# Patient Record
Sex: Male | Born: 1970 | Race: White | Hispanic: Yes | Smoking: Former smoker
Health system: Southern US, Community
[De-identification: ages and names within clinical notes are randomized; demographics above are authoritative.]

## PROBLEM LIST (undated history)

## (undated) DIAGNOSIS — Z8673 Personal history of transient ischemic attack (TIA), and cerebral infarction without residual deficits: Secondary | ICD-10-CM

## (undated) DIAGNOSIS — Z87828 Personal history of other (healed) physical injury and trauma: Secondary | ICD-10-CM

## (undated) DIAGNOSIS — K219 Gastro-esophageal reflux disease without esophagitis: Secondary | ICD-10-CM

## (undated) DIAGNOSIS — F419 Anxiety disorder, unspecified: Secondary | ICD-10-CM

## (undated) DIAGNOSIS — I10 Essential (primary) hypertension: Secondary | ICD-10-CM

## (undated) DIAGNOSIS — F1010 Alcohol abuse, uncomplicated: Secondary | ICD-10-CM

## (undated) DIAGNOSIS — D696 Thrombocytopenia, unspecified: Secondary | ICD-10-CM

## (undated) DIAGNOSIS — R748 Abnormal levels of other serum enzymes: Secondary | ICD-10-CM

## (undated) DIAGNOSIS — S83207A Unspecified tear of unspecified meniscus, current injury, left knee, initial encounter: Secondary | ICD-10-CM

## (undated) DIAGNOSIS — F4310 Post-traumatic stress disorder, unspecified: Secondary | ICD-10-CM

## (undated) HISTORY — PX: SKIN GRAFT FULL THICKNESS ARM: SUR1297

## (undated) HISTORY — PX: WISDOM TOOTH EXTRACTION: SHX21

## (undated) HISTORY — PX: POSTERIOR FUSION CERVICAL SPINE: SUR628

---

## 1999-07-22 ENCOUNTER — Emergency Department (HOSPITAL_COMMUNITY): Admission: EM | Admit: 1999-07-22 | Discharge: 1999-07-22 | Payer: Self-pay | Admitting: *Deleted

## 1999-07-29 ENCOUNTER — Ambulatory Visit (HOSPITAL_COMMUNITY): Admission: RE | Admit: 1999-07-29 | Discharge: 1999-07-29 | Payer: Self-pay | Admitting: Orthopedic Surgery

## 1999-11-03 HISTORY — PX: WRIST SURGERY: SHX841

## 2000-04-15 ENCOUNTER — Emergency Department (HOSPITAL_COMMUNITY): Admission: EM | Admit: 2000-04-15 | Discharge: 2000-04-15 | Payer: Self-pay | Admitting: Emergency Medicine

## 2003-06-04 ENCOUNTER — Emergency Department (HOSPITAL_COMMUNITY): Admission: EM | Admit: 2003-06-04 | Discharge: 2003-06-04 | Payer: Self-pay | Admitting: Emergency Medicine

## 2003-06-04 ENCOUNTER — Encounter: Payer: Self-pay | Admitting: Emergency Medicine

## 2004-04-04 ENCOUNTER — Emergency Department (HOSPITAL_COMMUNITY): Admission: EM | Admit: 2004-04-04 | Discharge: 2004-04-04 | Payer: Self-pay | Admitting: Emergency Medicine

## 2004-05-28 ENCOUNTER — Emergency Department (HOSPITAL_COMMUNITY): Admission: EM | Admit: 2004-05-28 | Discharge: 2004-05-28 | Payer: Self-pay | Admitting: Emergency Medicine

## 2005-01-12 ENCOUNTER — Ambulatory Visit (HOSPITAL_COMMUNITY): Admission: RE | Admit: 2005-01-12 | Discharge: 2005-01-12 | Payer: Self-pay

## 2005-01-27 ENCOUNTER — Emergency Department (HOSPITAL_COMMUNITY): Admission: EM | Admit: 2005-01-27 | Discharge: 2005-01-27 | Payer: Self-pay | Admitting: Emergency Medicine

## 2005-08-24 ENCOUNTER — Emergency Department: Payer: Self-pay | Admitting: Emergency Medicine

## 2006-06-22 ENCOUNTER — Emergency Department (HOSPITAL_COMMUNITY): Admission: EM | Admit: 2006-06-22 | Discharge: 2006-06-23 | Payer: Self-pay | Admitting: Emergency Medicine

## 2006-09-14 ENCOUNTER — Ambulatory Visit: Payer: Self-pay

## 2010-07-27 ENCOUNTER — Emergency Department (HOSPITAL_COMMUNITY)
Admission: EM | Admit: 2010-07-27 | Discharge: 2010-07-27 | Payer: Self-pay | Source: Home / Self Care | Admitting: Emergency Medicine

## 2010-11-27 ENCOUNTER — Ambulatory Visit (HOSPITAL_COMMUNITY)
Admission: RE | Admit: 2010-11-27 | Discharge: 2010-11-27 | Payer: Self-pay | Source: Home / Self Care | Attending: Orthopedic Surgery | Admitting: Orthopedic Surgery

## 2010-11-27 LAB — SURGICAL PCR SCREEN
MRSA, PCR: NEGATIVE
Staphylococcus aureus: NEGATIVE

## 2010-11-27 LAB — BASIC METABOLIC PANEL
BUN: 14 mg/dL (ref 6–23)
Creatinine, Ser: 0.98 mg/dL (ref 0.4–1.5)
GFR calc Af Amer: >60 mL/min (ref >60)
GFR calc non Af Amer: >60 mL/min (ref >60)
Glucose, Bld: 109 mg/dL — ABNORMAL HIGH (ref 70–99)
Sodium: 140 mEq/L (ref 135–145)

## 2010-11-27 LAB — CBC
HCT: 46.8 % (ref 39.0–52.0)
Hemoglobin: 16.9 g/dL (ref 13.0–17.0)
Platelets: 112 10*3/uL — ABNORMAL LOW (ref 150–400)
RBC: 5.57 MIL/uL (ref 4.22–5.81)
RDW: 12.2 % (ref 11.5–15.5)
WBC: 5.8 10*3/uL (ref 4.0–10.5)

## 2010-12-01 ENCOUNTER — Ambulatory Visit (HOSPITAL_COMMUNITY)
Admission: RE | Admit: 2010-12-01 | Discharge: 2010-12-02 | Payer: Self-pay | Source: Home / Self Care | Attending: Neurological Surgery | Admitting: Neurological Surgery

## 2010-12-25 NOTE — Op Note (Signed)
Christopher [name], Christopher [name]              ACCOUNT NO.:  [id number]  MEDICAL RECORD NO.:  [id number]          PATIENT TYPE:  OIB  LOCATION:  3537                         FACILITY:  [facility]  PHYSICIAN:  Stefani Dama, M.D.  DATE OF BIRTH:  02/21/1971  DATE OF PROCEDURE:  12/01/2010 DATE OF DISCHARGE:                              OPERATIVE REPORT   PREOPERATIVE DIAGNOSIS:  Cervical spondylosis and herniated nucleus pulposus C4-5 left with left cervical radiculopathy.  POSTOPERATIVE DIAGNOSIS:  Cervical spondylosis and herniated nucleus pulposus C4-5 left with left cervical radiculopathy.  PROCEDURE:  METRx diskectomy C4-5 left, operating microscope, microdissection technique.  SURGEON:  Stefani Dama, MD  FIRST ASSISTANT:  Cristi Loron, MD  ANESTHESIA:  General endotracheal.  INDICATIONS:  Christopher [name] is a 40 year old individual who has had significant left neck, shoulder, and arm pain proximally in the region of the deltoid and biceps on the left side.  He occasionally will get some radicular pain on the radial aspect of the forearm.  Most of his problem has been relegated to the region of the shoulder, and he has evidence of spondylitic disease at C5-6 and to the lesser extent C4-5 with a fragment of disk in the foramen on the left side at C4-5.  He is advised regarding options for surgery and planned cervical laminotomy, foraminotomy, and diskectomy using METRx system in a seated position, C4- 5 on the left side.  PROCEDURE:  The patient was brought to the operating room having had central monitoring catheter placed in the preop holding area.  After the smooth induction of general endotracheal anesthesia, he was placed in a 3-pin headrest and placed into the seated position.  The fluoroscope was brought into the appropriate position for obtaining lateral images and C4-5 was localized.  The back of the neck was then prepped with alcohol and DuraPrep, draped in sterile  fashion.  C4-5 was again localized with the needle and the skin above this area was infiltrated with 10 mL of lidocaine and 0.5% Marcaine, 50/50 with 1:200,000 epinephrine.  A small vertical incision was made over the chosen area on the left side and then a K-wire was placed down to the laminar arch of L4 at the junction of the facet.  A winding technique was then used to enlarge this opening and place a series of dilators to the 18-mm diameter.  An 18-mm x 5 cm deep endoscopic cannula was then fitted to the operating table clamp. Through this aperture, the operating microscope was brought into position and the soft tissues at the depth of the opening between the lamina of C4 and C5 were cleared.  Once the soft tissues were cleared and the laminotomy site was identified, then a high-speed drill was used under high magnification to remove the inferior margin lamina out to the medial wall facet.  The C4 and C5 several fluoroscopic images were obtained during this procedure to look at her progress as we uncovered the foramen and then identified the path of C5 nerve root in the foramen.  Nerve root was noted to be placed rather dorsally as though a significant ventral mass was  pushing it in that direction.  Once an adequate foraminotomy over the nerve was created, epidural venous bleeding was controlled with bipolar cautery and some small pledgets of Gelfoam along with incising the epidural veins were cauterized.  The inferior aspect of the nerve was then identified and cleared and after clearing some bone in the region of the foramen just at the junction with pedicle, I was able to mobilize the nerve root superiorly and medially in this area.  A significant bony ridge was encountered. Further palpation yielded no evidence of disk fragment.  The root was then tracked laterally and by gently dissecting in a rather large foraminotomy, I could palpate out under the nerve root sleeve to see  if there is any disk fragments.  None were found.  Because the mass was felt to be substantial from the superior aspect of the nerve root, I then explored and we felt a significant mass.  However, upon incising this, this was a superior edge of the inferior vertebrae's endplate and after incising this area, only some small fragments of disk were obtained here, but none of the large fragments I anticipated based on the images.  We then completed a foraminotomy and completed dissection from the superior aspect and in the end the nerve root was freed inferiorly and superiorly, but there was a ventral mass from the superior endplate of the C5 vertebrae in the lateral region.  Hemostasis was achieved in the soft tissues.  Further dissection and examination of this area above both myself and Dr. Lovell Sheehan yielded no fragments of disk.  With this, we completed the procedure, removed the microscope, removed the endoscopic cannula, irrigated the wound copiously with antibiotic irrigating solution and closed the fascia with 3-0 Vicryl interrupted fashion, 3-0 Vicryl using subcuticular tissues.  Dermabond was placed on the skin.  Blood loss for the procedure was nil.     Stefani Dama, M.D.     Merla Riches  D:  12/01/2010  T:  12/02/2010  Job:  [id number]  Electronically Signed by Barnett Abu M.D. on 12/25/2010 08:01:48 AM

## 2011-04-22 ENCOUNTER — Observation Stay (HOSPITAL_COMMUNITY)
Admission: EM | Admit: 2011-04-22 | Discharge: 2011-04-23 | Disposition: A | Discharge status: Home / Self Care | Payer: Self-pay | Attending: Emergency Medicine | Admitting: Emergency Medicine

## 2011-04-22 ENCOUNTER — Emergency Department (HOSPITAL_COMMUNITY): Payer: Self-pay

## 2011-04-22 DIAGNOSIS — R0602 Shortness of breath: Secondary | ICD-10-CM | POA: Insufficient documentation

## 2011-04-22 DIAGNOSIS — Z79899 Other long term (current) drug therapy: Secondary | ICD-10-CM | POA: Insufficient documentation

## 2011-04-22 DIAGNOSIS — R071 Chest pain on breathing: Principal | ICD-10-CM | POA: Insufficient documentation

## 2011-04-22 DIAGNOSIS — I10 Essential (primary) hypertension: Secondary | ICD-10-CM | POA: Insufficient documentation

## 2011-04-22 LAB — TROPONIN I
Troponin I: <0.3 ng/mL (ref <0.30)
Troponin I: <0.3 ng/mL (ref <0.30)

## 2011-04-22 LAB — CK TOTAL AND CKMB (NOT AT ARMC)
CK, MB: 5.1 ng/mL — ABNORMAL HIGH (ref 0.3–4.0)
CK, MB: 4.3 ng/mL — ABNORMAL HIGH (ref 0.3–4.0)
Relative Index: 2.1 (ref 0.0–2.5)
Total CK: 205 U/L (ref 7–232)

## 2011-04-22 LAB — POCT I-STAT, CHEM 8
Creatinine, Ser: 0.9 mg/dL (ref 0.50–1.35)
HCT: 45 % (ref 39.0–52.0)
Sodium: 143 mEq/L (ref 135–145)

## 2011-04-23 ENCOUNTER — Observation Stay (HOSPITAL_COMMUNITY): Payer: Self-pay

## 2011-04-23 MED ORDER — IOHEXOL 350 MG/ML SOLN: 80.0000 mL | Freq: Once | INTRAVENOUS | Status: DC | PRN

## 2011-05-11 ENCOUNTER — Emergency Department (HOSPITAL_COMMUNITY): Payer: Self-pay

## 2011-05-11 ENCOUNTER — Emergency Department (HOSPITAL_COMMUNITY)
Admission: EM | Admit: 2011-05-11 | Discharge: 2011-05-11 | Disposition: A | Discharge status: Home / Self Care | Payer: Self-pay | Attending: Emergency Medicine | Admitting: Emergency Medicine

## 2011-05-11 DIAGNOSIS — M79609 Pain in unspecified limb: Secondary | ICD-10-CM | POA: Insufficient documentation

## 2011-05-11 DIAGNOSIS — H11009 Unspecified pterygium of unspecified eye: Secondary | ICD-10-CM | POA: Insufficient documentation

## 2011-05-11 DIAGNOSIS — S60229A Contusion of unspecified hand, initial encounter: Secondary | ICD-10-CM | POA: Insufficient documentation

## 2011-05-11 DIAGNOSIS — Z79899 Other long term (current) drug therapy: Secondary | ICD-10-CM | POA: Insufficient documentation

## 2011-05-11 DIAGNOSIS — H1130 Conjunctival hemorrhage, unspecified eye: Secondary | ICD-10-CM | POA: Insufficient documentation

## 2011-08-24 ENCOUNTER — Emergency Department (HOSPITAL_COMMUNITY)
Admission: EM | Admit: 2011-08-24 | Discharge: 2011-08-24 | Disposition: A | Discharge status: Home / Self Care | Payer: Worker's Compensation | Attending: Emergency Medicine | Admitting: Emergency Medicine

## 2011-08-24 DIAGNOSIS — M5380 Other specified dorsopathies, site unspecified: Secondary | ICD-10-CM | POA: Insufficient documentation

## 2011-08-24 DIAGNOSIS — M549 Dorsalgia, unspecified: Secondary | ICD-10-CM | POA: Insufficient documentation

## 2011-08-24 DIAGNOSIS — I10 Essential (primary) hypertension: Secondary | ICD-10-CM | POA: Insufficient documentation

## 2012-01-18 IMAGING — CR DG HAND COMPLETE 3+V*R*
3 series · 3 of 3 positions shown · non-contrast
Comparison: None.

CLINICAL DATA: Metacarpal pain and swelling after injury.

RIGHT HAND - COMPLETE 3+ VIEW

[x hand pa right *]
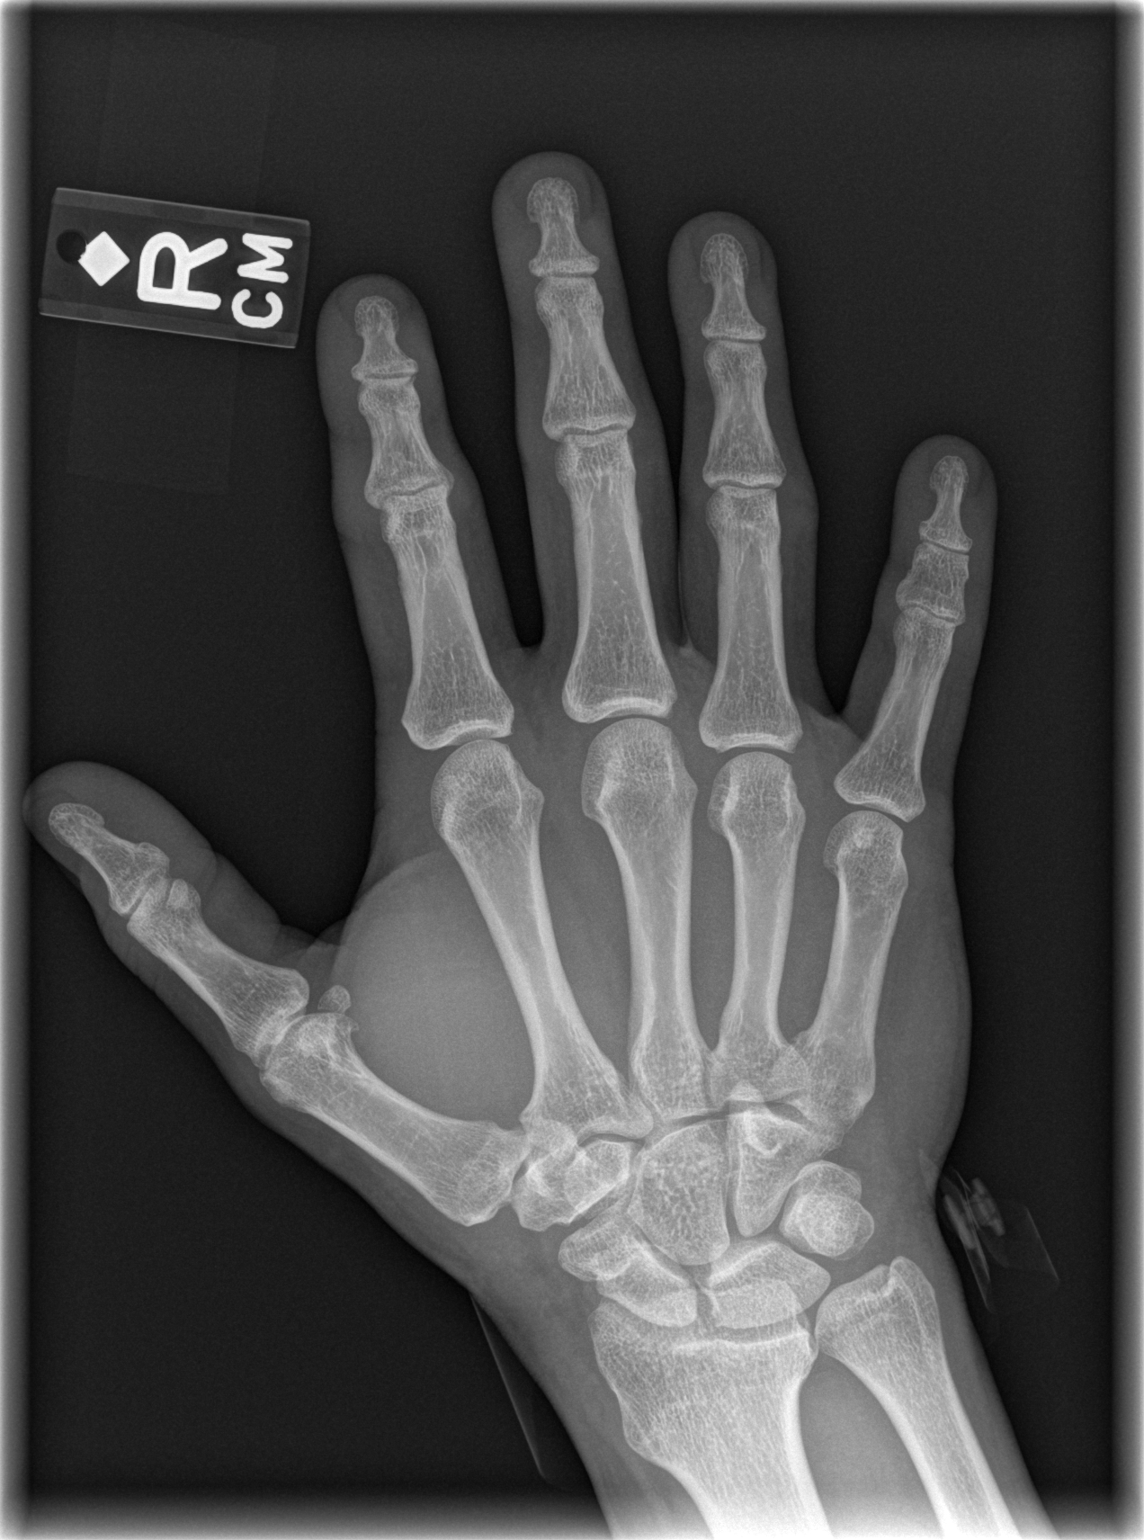

[x hand oblique right *]
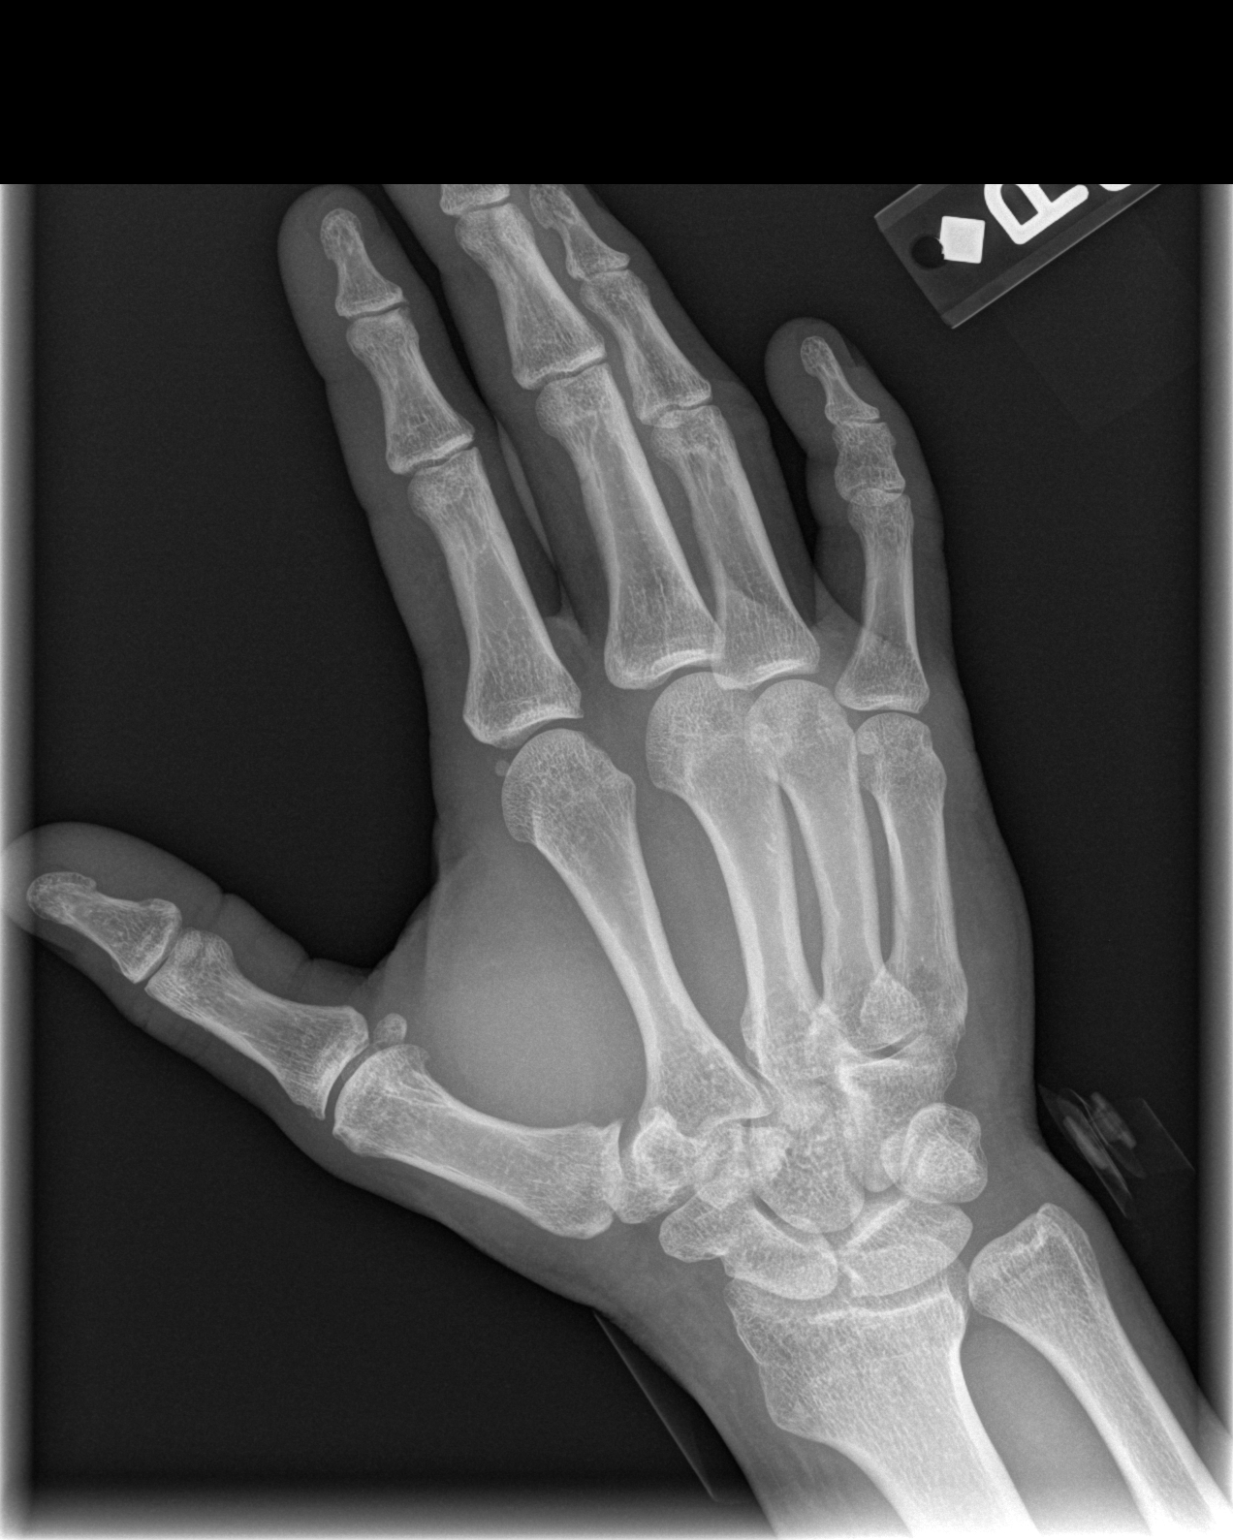

[x hand lat right]
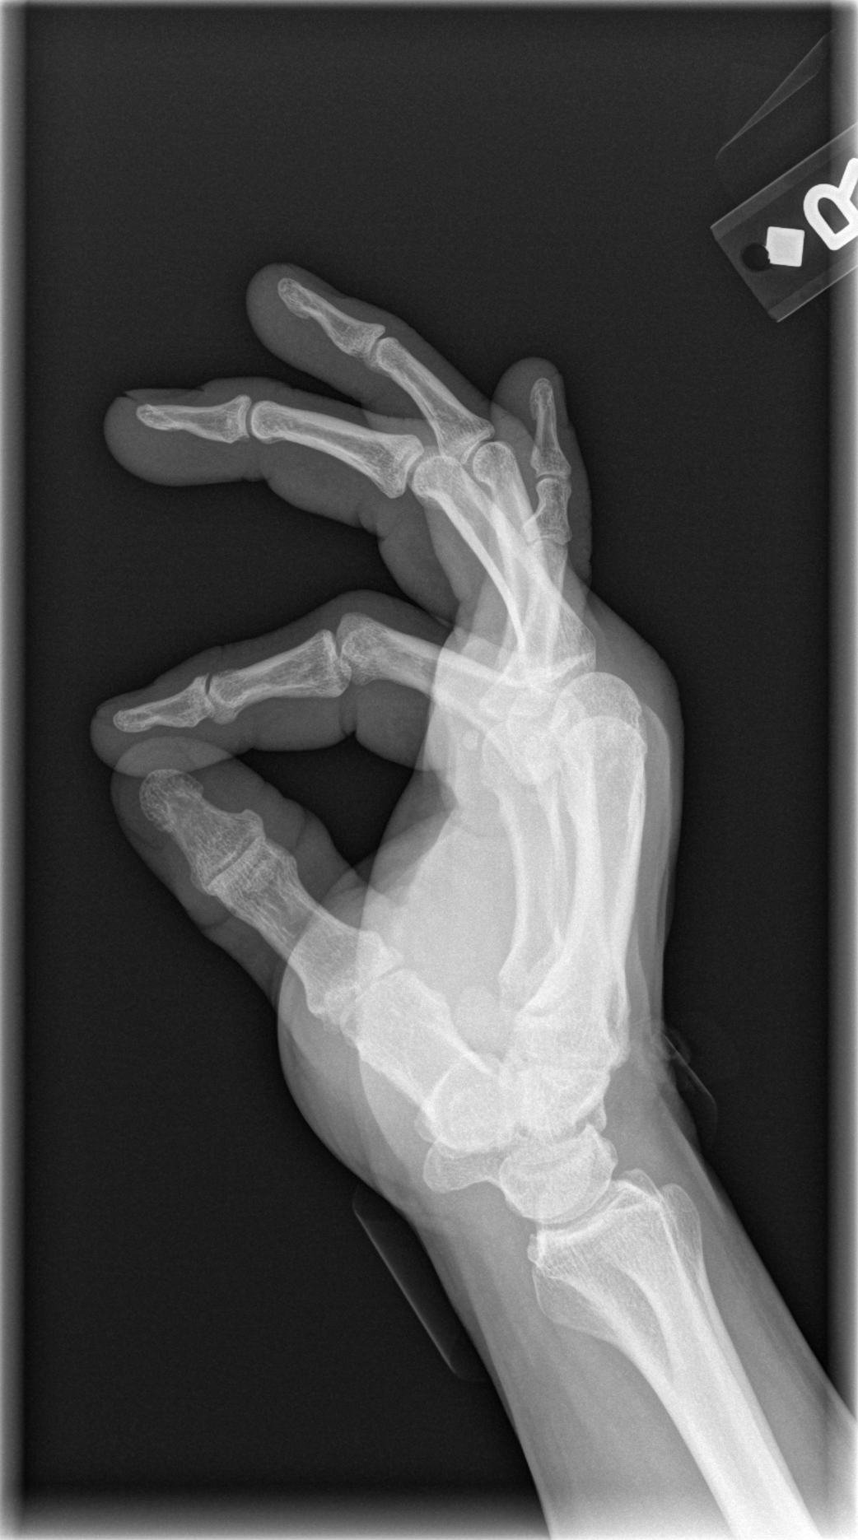

[3 of 3 positions shown; findings below may reference images not displayed]

FINDINGS: Mild degenerative changes in the carpus, first metacarpal
phalangeal joint, and interphalangeal joints. No evidence of acute
fracture or subluxation.  No focal bone lesions.  Bone matrix and
cortex appear intact.  No abnormal radiopaque densities in the soft
tissues.
IMPRESSION: Mild degenerative changes in the hand and wrist.  No acute fracture
or subluxation demonstrated.

## 2012-04-20 ENCOUNTER — Emergency Department (HOSPITAL_COMMUNITY)
Admission: EM | Admit: 2012-04-20 | Discharge: 2012-04-20 | Disposition: A | Discharge status: Home / Self Care | Payer: Self-pay | Attending: Emergency Medicine | Admitting: Emergency Medicine

## 2012-04-20 ENCOUNTER — Encounter (HOSPITAL_COMMUNITY): Payer: Self-pay | Admitting: Emergency Medicine

## 2012-04-20 DIAGNOSIS — F17200 Nicotine dependence, unspecified, uncomplicated: Secondary | ICD-10-CM | POA: Insufficient documentation

## 2012-04-20 DIAGNOSIS — M545 Low back pain, unspecified: Secondary | ICD-10-CM | POA: Insufficient documentation

## 2012-04-20 DIAGNOSIS — I10 Essential (primary) hypertension: Secondary | ICD-10-CM | POA: Insufficient documentation

## 2012-04-20 HISTORY — DX: Essential (primary) hypertension: I10

## 2012-04-20 MED ORDER — KETOROLAC TROMETHAMINE 30 MG/ML IJ SOLN
60.0000 mg | Freq: Once | INTRAMUSCULAR | Status: AC
  Administered 2012-04-20: 60 mg via INTRAMUSCULAR
  Filled 2012-04-20: qty 2

## 2012-04-20 MED ORDER — KETOROLAC TROMETHAMINE 10 MG PO TABS: 10.0000 mg | ORAL_TABLET | Freq: Four times a day (QID) | ORAL | Status: AC | PRN

## 2012-04-20 MED ORDER — OXYCODONE-ACETAMINOPHEN 5-325 MG PO TABS: 1.0000 | ORAL_TABLET | ORAL | Status: AC | PRN

## 2012-04-20 NOTE — ED Notes (Signed)
Pt. Stated, I was the Dr. Derek Jack for lower side pain which he said I have a hernia , but I'm having back pain since Friday

## 2012-04-20 NOTE — Discharge Instructions (Signed)
Follow with your PCP tomorrow. Return to ED for any worsening of condition. Do not combine ketorolac (toradol) with any other NSAID (motrin, ibuprofen, Advil, aleve , aspirin, naproxen etc.) Take fist ketorolac pill tomorrow, you have already had a shot today  Back Pain, Adult Back pain is very common. The pain often gets better over time. The cause of back pain is usually not dangerous. Most people can learn to manage their back pain on their own.  HOME CARE   Stay active. Start with short walks on flat ground if you can. Try to walk farther each day.   Do not sit, drive, or stand in one place for more than 30 minutes. Do not stay in bed.   Do not avoid exercise or work. Activity can help your back heal faster.   Be careful when you bend or lift an object. Bend at your knees, keep the object close to you, and do not twist.   Sleep on a firm mattress. Lie on your side, and bend your knees. If you lie on your back, put a pillow under your knees.   Only take medicines as told by your doctor.   Put ice on the injured area.   Put ice in a plastic bag.   Place a towel between your skin and the bag.   Leave the ice on for 15 to 20 minutes, 3 to 4 times a day for the first 2 to 3 days. After that, you can switch between ice and heat packs.   Ask your doctor about back exercises or massage.   Avoid feeling anxious or stressed. Find good ways to deal with stress, such as exercise.  GET HELP RIGHT AWAY IF:   Your pain does not go away with rest or medicine.   Your pain does not go away in 1 week.   You have new problems.   You do not feel well.   The pain spreads into your legs.   You cannot control when you poop (bowel movement) or pee (urinate).   Your arms or legs feel weak or lose feeling (numbness).   You feel sick to your stomach (nauseous) or throw up (vomit).   You have belly (abdominal) pain.   You feel like you may pass out (faint).  MAKE SURE YOU:   Understand  these instructions.   Will watch your condition.   Will get help right away if you are not doing well or get worse.  Document Released: 04/06/2008 Document Revised: 10/08/2011 Document Reviewed: 03/09/2011 [facility] Patient Information 2012 [place], [place].

## 2012-04-20 NOTE — ED Provider Notes (Signed)
History     CSN: [id number]  Arrival date & time 04/20/12  4098   First MD Initiated Contact with Patient 04/20/12 1013      Chief Complaint  Patient presents with  . Back Pain    (Consider location/radiation/quality/duration/timing/severity/associated sxs/prior treatment) Patient is a 41 y.o. male presenting with back pain. The history is provided by the patient.  Back Pain  This is a new problem. The current episode started more than 2 days ago. The problem occurs constantly. The problem has been gradually worsening. The pain is associated with no known injury. The pain is present in the lumbar spine. The pain is at a severity of 8/10. The symptoms are aggravated by bending.  41 y/o male iNAD c/o right low back pain x6 days. Pt recently moved and developed a  Right inguinal hernia. Denies fever, change in bowel or bladder habits, numbness/parasthesia and  other red flags for back pain. Pt has tried motrin and muscle relaxers with out relief.   Past Medical History  Diagnosis Date  . Hernia   . Hypertension     History reviewed. No pertinent past surgical history.  No family history on file.  History  Substance Use Topics  . Smoking status: Current Everyday Smoker    Types: Cigarettes  . Smokeless tobacco: Not on file  . Alcohol Use: Yes      Review of Systems  Musculoskeletal: Positive for back pain.  All other systems reviewed and are negative.    Allergies  Review of patient's allergies indicates no known allergies.  Home Medications   Current Outpatient Rx  Name Route Sig Dispense Refill  . ALBUTEROL SULFATE HFA 108 (90 BASE) MCG/ACT IN AERS Inhalation Inhale 2 puffs into the lungs every 6 (six) hours as needed. For shortness of breath    . ALPRAZOLAM 1 MG PO TABS Oral Take 1 mg by mouth 3 (three) times daily as needed. For anxiety    . ADULT MULTIVITAMIN W/MINERALS CH Oral Take 1 tablet by mouth daily.    . TRIAMTERENE-HCTZ 37.5-25 MG PO TABS Oral Take 1  tablet by mouth daily.      BP 133/74  Pulse 86  Temp 97.6 F (36.4 C) (Oral)  Resp 19  SpO2 95%  Physical Exam  Vitals reviewed. Constitutional: He is oriented to person, place, and time. He appears well-developed and well-nourished. No distress.  HENT:  Head: Normocephalic.  Eyes: Conjunctivae and EOM are normal.  Neck: Normal range of motion.  Cardiovascular: Normal rate, regular rhythm and normal heart sounds.   Pulmonary/Chest: Effort normal.  Abdominal: Soft. Bowel sounds are normal.  Musculoskeletal: Normal range of motion.       Left lumbar muscle spasm with point tenderness  Neurological: He is alert and oriented to person, place, and time.  Psychiatric: He has a normal mood and affect.    ED Course  Procedures (including critical care time)  Labs Reviewed - No data to display No results found.   No diagnosis found. Lumbago   MDM  41 y/o male iNAD c/o left lumbar back pain after moving x5 days ago. Pt rec'd toradol IM and will d/c with toradol PO and 6 percocet       Wynetta Emery, PA-C 04/20/12 1030

## 2012-04-20 NOTE — ED Provider Notes (Signed)
Medical screening examination/treatment/procedure(s) were performed by non-physician practitioner and as supervising physician I was immediately available for consultation/collaboration.  Cheri Guppy, MD 04/20/12 [phone]

## 2012-05-17 ENCOUNTER — Ambulatory Visit (INDEPENDENT_AMBULATORY_CARE_PROVIDER_SITE_OTHER): Payer: PRIVATE HEALTH INSURANCE | Admitting: Surgery

## 2014-06-05 ENCOUNTER — Emergency Department (HOSPITAL_COMMUNITY)
Admission: EM | Admit: 2014-06-05 | Discharge: 2014-06-05 | Disposition: A | Discharge status: Home / Self Care | Payer: BC Managed Care – PPO | Attending: Emergency Medicine | Admitting: Emergency Medicine

## 2014-06-05 ENCOUNTER — Encounter (HOSPITAL_COMMUNITY): Payer: Self-pay | Admitting: Emergency Medicine

## 2014-06-05 DIAGNOSIS — T63464A Toxic effect of venom of wasps, undetermined, initial encounter: Secondary | ICD-10-CM

## 2014-06-05 DIAGNOSIS — R269 Unspecified abnormalities of gait and mobility: Secondary | ICD-10-CM | POA: Diagnosis not present

## 2014-06-05 DIAGNOSIS — Z8719 Personal history of other diseases of the digestive system: Secondary | ICD-10-CM | POA: Diagnosis not present

## 2014-06-05 DIAGNOSIS — F17200 Nicotine dependence, unspecified, uncomplicated: Secondary | ICD-10-CM | POA: Diagnosis not present

## 2014-06-05 DIAGNOSIS — L03119 Cellulitis of unspecified part of limb: Secondary | ICD-10-CM

## 2014-06-05 DIAGNOSIS — T6391XA Toxic effect of contact with unspecified venomous animal, accidental (unintentional), initial encounter: Secondary | ICD-10-CM | POA: Insufficient documentation

## 2014-06-05 DIAGNOSIS — T63461A Toxic effect of venom of wasps, accidental (unintentional), initial encounter: Secondary | ICD-10-CM | POA: Insufficient documentation

## 2014-06-05 DIAGNOSIS — Y939 Activity, unspecified: Secondary | ICD-10-CM | POA: Diagnosis not present

## 2014-06-05 DIAGNOSIS — L02419 Cutaneous abscess of limb, unspecified: Secondary | ICD-10-CM | POA: Diagnosis not present

## 2014-06-05 DIAGNOSIS — L03115 Cellulitis of right lower limb: Secondary | ICD-10-CM

## 2014-06-05 DIAGNOSIS — Z79899 Other long term (current) drug therapy: Secondary | ICD-10-CM | POA: Diagnosis not present

## 2014-06-05 DIAGNOSIS — I10 Essential (primary) hypertension: Secondary | ICD-10-CM | POA: Insufficient documentation

## 2014-06-05 DIAGNOSIS — Y929 Unspecified place or not applicable: Secondary | ICD-10-CM | POA: Diagnosis not present

## 2014-06-05 MED ORDER — CLINDAMYCIN HCL 150 MG PO CAPS: 450.0000 mg | ORAL_CAPSULE | Freq: Three times a day (TID) | ORAL | Status: DC

## 2014-06-05 MED ORDER — IBUPROFEN 800 MG PO TABS: 800.0000 mg | ORAL_TABLET | Freq: Three times a day (TID) | ORAL | Status: DC

## 2014-06-05 MED ORDER — HYDROCODONE-ACETAMINOPHEN 5-325 MG PO TABS: 1.0000 | ORAL_TABLET | ORAL | Status: DC | PRN

## 2014-06-05 MED ORDER — OXYCODONE-ACETAMINOPHEN 5-325 MG PO TABS
2.0000 | ORAL_TABLET | Freq: Once | ORAL | Status: AC
  Administered 2014-06-05: 2 via ORAL
  Filled 2014-06-05: qty 2

## 2014-06-05 MED ORDER — DIPHENHYDRAMINE HCL 25 MG PO TABS: 25.0000 mg | ORAL_TABLET | Freq: Four times a day (QID) | ORAL | Status: DC

## 2014-06-05 NOTE — Discharge Instructions (Signed)
Call for a follow up appointment with a Family or Primary Care Provider.  Return for a wound check in 48 hours. Return if symptoms worsen, develop fever, chills, worsening pain, worsening redness. Take medication as prescribed.  Elevate your foot when you are not walking or standing. Apply ice to your foot and leg 3-4 times a day.

## 2014-06-05 NOTE — ED Provider Notes (Signed)
aCSN: [id number]     Arrival date & time 06/05/14  1958 History  This chart was scribed for non-physician practitioner working with Hurman HornJohn M Bednar, MD, by Modena JanskyAlbert Thayil, ED Scribe. This patient was seen in room TR09C/TR09C and the patient's care was started at 10:20 PM.   Chief Complaint  Patient presents with  . Insect Bite   HPI Comments: Christopher [name] is a 43 y.o. male who presents to the Emergency Department complaining of an insect bite that occurred yesterday afternoon. He states that he was stung by a yellow jacket on his right ankle. He reports associated pain and swelling on his right ankle. He describes the pain as a throbbing feeling. Patient also reports he had a partial lesion which his daughter "pop" yesterday. He states that he can't put much weight on his ankle. He reports he took ibuprofen, and benadryl pills and cream with no relief. He reports that he has been stung by yellow jackets in the past. He denies any hx of DM or any known allergies to medications. He also denies any abdominal pain or other rash.  PCP- [facility]  The history is provided by the patient. No language interpreter was used.   PCP- [facility]  Past Medical History  Diagnosis Date  . Hernia   . Hypertension    Past Surgical History  Procedure Laterality Date  . Neck surgery    . Wrist surgery     No family history on file. History  Substance Use Topics  . Smoking status: Current Every Day Smoker    Types: Cigarettes  . Smokeless tobacco: Not on file  . Alcohol Use: Yes    Review of Systems  Constitutional: Negative for fever and chills.  Respiratory: Negative for wheezing.   Gastrointestinal: Negative for abdominal pain.  Musculoskeletal: Positive for gait problem, joint swelling and myalgias.  Skin: Positive for rash and wound.  All other systems reviewed and are negative.   Allergies  Review of patient's allergies indicates no known allergies.  Home Medications   Prior to Admission  medications   Medication Sig Start Date End Date Taking? Authorizing Provider  albuterol (PROVENTIL HFA;VENTOLIN HFA) 108 (90 BASE) MCG/ACT inhaler Inhale 2 puffs into the lungs every 6 (six) hours as needed. For shortness of breath   Yes Historical Provider, MD  ALPRAZolam Prudy Feeler(XANAX) 1 MG tablet Take 1 mg by mouth 3 (three) times daily as needed. For anxiety   Yes Historical Provider, MD  Multiple Vitamin (MULTIVITAMIN WITH MINERALS) TABS Take 1 tablet by mouth daily.   Yes Historical Provider, MD  triamterene-hydrochlorothiazide (MAXZIDE-25) 37.5-25 MG per tablet Take 1 tablet by mouth daily.   Yes Historical Provider, MD   BP 132/85  Pulse 97  Temp(Src) 98.3 F (36.8 C) (Oral)  Resp 18  SpO2 98% Physical Exam  Nursing note and vitals reviewed. Constitutional: He is oriented to person, place, and time. He appears well-developed and well-nourished.  Non-toxic appearance. He does not have a sickly appearance. He does not appear ill. No distress.  HENT:  Head: Normocephalic and atraumatic.  Neck: Neck supple.  Pulmonary/Chest: Effort normal. No respiratory distress.  Musculoskeletal: Normal range of motion.  Neurological: He is alert and oriented to person, place, and time.  Skin: Skin is warm and dry. He is not diaphoretic. There is erythema.  Right lower extremity with swelling from the distal tibia throughout foot with overlying erythema, tenderness palpation. Two scabbed lesions without drainage noted to lower extremity. Good cap refill.  Ambulates with a walking gait.  Psychiatric: He has a normal mood and affect. His behavior is normal.    ED Course  Procedures (including critical care time) COORDINATION OF CARE: 10:24 PM- Pt advised of plan for treatment and pt agrees.  Labs Review Labs Reviewed - No data to display  Imaging Review No results found.   EKG Interpretation None      MDM   Final diagnoses:  Cellulitis of right lower extremity  Yellow jacket sting,  undetermined intent, initial encounter   Patient presents with lower extremity swelling and overlying erythema, concerning for cellulitis. Also complains of yellow to sitting questional swelling due to local inflammatory responce due to sting or  cellulitis, will treat for both. Patient is afebrile and in no acute distress. Discussed treatment plan with the patient. Return precautions given. Reports understanding and no other concerns at this time.  Patient is stable for discharge at this time. Meds given in ED:  Medications  oxyCODONE-acetaminophen (PERCOCET/ROXICET) 5-325 MG per tablet 2 tablet (2 tablets Oral Given 06/05/14 2248)    Discharge Medication List as of 06/05/2014 11:18 PM    START taking these medications   Details  clindamycin (CLEOCIN) 150 MG capsule Take 3 capsules (450 mg total) by mouth 3 (three) times daily., Starting 06/05/2014, Until Discontinued, Print    diphenhydrAMINE (BENADRYL) 25 MG tablet Take 1 tablet (25 mg total) by mouth every 6 (six) hours., Starting 06/05/2014, Until Discontinued, Print    HYDROcodone-acetaminophen (NORCO/VICODIN) 5-325 MG per tablet Take 1 tablet by mouth every 4 (four) hours as needed., Starting 06/05/2014, Until Discontinued, Print    ibuprofen (ADVIL,MOTRIN) 800 MG tablet Take 1 tablet (800 mg total) by mouth 3 (three) times daily., Starting 06/05/2014, Until Discontinued, Print        I personally performed the services described in this documentation, which was scribed in my presence. The recorded information has been reviewed and is accurate.     Mellody Drown, PA-C 06/07/14 [phone]

## 2014-06-05 NOTE — ED Notes (Signed)
Pt. stung by a wasp at right ankle yesterday morning , presents with pain/swelling at right ankle , respirations unlabored / airway intact.

## 2014-06-19 NOTE — ED Provider Notes (Signed)
Medical screening examination/treatment/procedure(s) were performed by non-physician practitioner and as supervising physician I was immediately available for consultation/collaboration.   EKG Interpretation None       Hurman HornJohn M Merlina Marchena, MD 06/19/14 [phone]

## 2014-10-29 ENCOUNTER — Emergency Department (HOSPITAL_COMMUNITY): Payer: BC Managed Care – PPO

## 2014-10-29 ENCOUNTER — Encounter (HOSPITAL_COMMUNITY): Payer: Self-pay | Admitting: Emergency Medicine

## 2014-10-29 DIAGNOSIS — Z7982 Long term (current) use of aspirin: Secondary | ICD-10-CM | POA: Insufficient documentation

## 2014-10-29 DIAGNOSIS — Z79899 Other long term (current) drug therapy: Secondary | ICD-10-CM | POA: Insufficient documentation

## 2014-10-29 DIAGNOSIS — R0789 Other chest pain: Secondary | ICD-10-CM | POA: Insufficient documentation

## 2014-10-29 DIAGNOSIS — Z8719 Personal history of other diseases of the digestive system: Secondary | ICD-10-CM | POA: Insufficient documentation

## 2014-10-29 DIAGNOSIS — Z720 Tobacco use: Secondary | ICD-10-CM | POA: Insufficient documentation

## 2014-10-29 DIAGNOSIS — I10 Essential (primary) hypertension: Secondary | ICD-10-CM | POA: Insufficient documentation

## 2014-10-29 LAB — BASIC METABOLIC PANEL
Anion gap: 12 (ref 5–15)
BUN: 11 mg/dL (ref 6–23)
CALCIUM: 8.8 mg/dL (ref 8.4–10.5)
CO2: 22 mmol/L (ref 19–32)
Chloride: 106 mEq/L (ref 96–112)
Creatinine, Ser: 0.98 mg/dL (ref 0.50–1.35)
GFR calc Af Amer: >90 mL/min (ref >90)
GLUCOSE: 92 mg/dL (ref 70–99)
Potassium: 3.5 mmol/L (ref 3.5–5.1)
Sodium: 140 mmol/L (ref 135–145)

## 2014-10-29 LAB — I-STAT TROPONIN, ED: TROPONIN I, POC: 0 ng/mL (ref 0.00–0.08)

## 2014-10-29 MED ORDER — ASPIRIN 325 MG PO TABS
325.0000 mg | ORAL_TABLET | ORAL | Status: AC
  Administered 2014-10-30: 325 mg via ORAL
  Filled 2014-10-29: qty 1

## 2014-10-29 NOTE — ED Notes (Signed)
The patient said he started having chest pain about 1900hrs.  He denies any other symtpoms.  He rates his pain 6/10.  He says he has radiation to his left arm and it has been cramping for several months on an off.  He did have a stress test done several years ago for the same kind of pain.

## 2014-10-30 ENCOUNTER — Emergency Department (HOSPITAL_COMMUNITY)
Admission: EM | Admit: 2014-10-30 | Discharge: 2014-10-30 | Disposition: A | Discharge status: Home / Self Care | Payer: BC Managed Care – PPO | Attending: Emergency Medicine | Admitting: Emergency Medicine

## 2014-10-30 DIAGNOSIS — R0789 Other chest pain: Secondary | ICD-10-CM

## 2014-10-30 DIAGNOSIS — R079 Chest pain, unspecified: Secondary | ICD-10-CM

## 2014-10-30 LAB — CBC WITH DIFFERENTIAL/PLATELET
BASOS ABS: 0 10*3/uL (ref 0.0–0.1)
Basophils Relative: 1 % (ref 0–1)
EOS ABS: 0.2 10*3/uL (ref 0.0–0.7)
EOS PCT: 4 % (ref 0–5)
HCT: 44.3 % (ref 39.0–52.0)
Hemoglobin: 15.5 g/dL (ref 13.0–17.0)
Lymphocytes Relative: 35 % (ref 12–46)
Lymphs Abs: 2.1 10*3/uL (ref 0.7–4.0)
MCH: 29.8 pg (ref 26.0–34.0)
MCHC: 35 g/dL (ref 30.0–36.0)
MCV: 85.2 fL (ref 78.0–100.0)
Monocytes Absolute: 0.5 10*3/uL (ref 0.1–1.0)
Monocytes Relative: 8 % (ref 3–12)
Neutro Abs: 3.3 10*3/uL (ref 1.7–7.7)
Neutrophils Relative %: 54 % (ref 43–77)
PLATELETS: 98 10*3/uL — AB (ref 150–400)
RBC: 5.2 MIL/uL (ref 4.22–5.81)
RDW: 12.6 % (ref 11.5–15.5)
WBC: 6.1 10*3/uL (ref 4.0–10.5)

## 2014-10-30 LAB — I-STAT TROPONIN, ED: Troponin i, poc: 0 ng/mL (ref 0.00–0.08)

## 2014-10-30 LAB — BRAIN NATRIURETIC PEPTIDE: B Natriuretic Peptide: 21.1 pg/mL (ref 0.0–100.0)

## 2014-10-30 MED ORDER — NAPROXEN 500 MG PO TABS: 500.0000 mg | ORAL_TABLET | Freq: Two times a day (BID) | ORAL | Status: DC

## 2014-10-30 MED ORDER — IBUPROFEN 200 MG PO TABS
600.0000 mg | ORAL_TABLET | Freq: Once | ORAL | Status: AC
  Administered 2014-10-30: 600 mg via ORAL
  Filled 2014-10-30: qty 3

## 2014-10-30 NOTE — ED Provider Notes (Signed)
CSN: [id number]     Arrival date & time 10/29/14  2246 History   None   This chart was scribed for Christopher SproutWhitney Jhania Etherington, MD by Marica OtterNusrat Rahman, ED Scribe. This patient was seen in room A05C/A05C and the patient's care was started at 1:42 AM.  Chief Complaint  Patient presents with  . Chest Pain    The patient said he started having chest pain about 1900hrs.  He denies any other symtpoms.   The history is provided by the patient. No language interpreter was used.   PCP: Marcine MatarPHILLIPS JR, CHARLES W, MD HPI Comments: Christopher [name] is a 43 y.o. male, with Hx of HTN, who presents to the Emergency Department complaining of recurrent, atraumatic, constant, stabbing/throbbing, improving chest pain that does not radiate with onset at 7PM last night while at work. Pt denies any other Sx and rates his present pain a 6 out of 10. Pt denies eating immediately prior to the onset of his Sx. Pt denies taking any measures PTA to alleviate his Sx. Pt also notes that his left arm has been intermittently cramping up for the past 2-3 months. Pt reports experiencing similar pain  several years ago and consequently having a stress test done. Pt denies nausea, cough, sob, recent travel, swelling of LE. Pt denies any family Hx of heart problems, DM, stroke.   Past Medical History  Diagnosis Date  . Hernia   . Hypertension    Past Surgical History  Procedure Laterality Date  . Neck surgery    . Wrist surgery     History reviewed. No pertinent family history. History  Substance Use Topics  . Smoking status: Current Every Day Smoker    Types: Cigarettes  . Smokeless tobacco: Not on file  . Alcohol Use: Yes    Review of Systems  A complete 10 system review of systems was obtained and all systems are negative except as noted in the HPI and PMH.    Allergies  Review of patient's allergies indicates no known allergies.  Home Medications   Prior to Admission medications   Medication Sig Start Date End Date  Taking? Authorizing Provider  albuterol (PROVENTIL HFA;VENTOLIN HFA) 108 (90 BASE) MCG/ACT inhaler Inhale 2 puffs into the lungs every 6 (six) hours as needed. For shortness of breath   Yes Historical Provider, MD  ALPRAZolam Prudy Feeler(XANAX) 1 MG tablet Take 1 mg by mouth 3 (three) times daily as needed. For anxiety   Yes Historical Provider, MD  aspirin EC 81 MG tablet Take 81 mg by mouth daily.   Yes Historical Provider, MD  Multiple Vitamin (MULTIVITAMIN WITH MINERALS) TABS Take 1 tablet by mouth daily.   Yes Historical Provider, MD  triamterene-hydrochlorothiazide (MAXZIDE-25) 37.5-25 MG per tablet Take 1 tablet by mouth daily.   Yes Historical Provider, MD  clindamycin (CLEOCIN) 150 MG capsule Take 3 capsules (450 mg total) by mouth 3 (three) times daily. Patient not taking: Reported on 10/30/2014 06/05/14   Mellody DrownLauren Parker, PA-C  diphenhydrAMINE (BENADRYL) 25 MG tablet Take 1 tablet (25 mg total) by mouth every 6 (six) hours. Patient not taking: Reported on 10/30/2014 06/05/14   Mellody DrownLauren Parker, PA-C  HYDROcodone-acetaminophen (NORCO/VICODIN) 5-325 MG per tablet Take 1 tablet by mouth every 4 (four) hours as needed. Patient not taking: Reported on 10/30/2014 06/05/14   Mellody DrownLauren Parker, PA-C  ibuprofen (ADVIL,MOTRIN) 800 MG tablet Take 1 tablet (800 mg total) by mouth 3 (three) times daily. Patient not taking: Reported on 10/30/2014 06/05/14   [name]  Jimmey RalphParker, PA-C   Triage Vitals: BP 124/76 mmHg  Pulse 63  Temp(Src) 98.9 F (37.2 C) (Oral)  Resp 18  Ht 5\' 10"  (1.778 m)  Wt 230 lb (104.327 kg)  BMI 33.00 kg/m2  SpO2 96% Physical Exam  Constitutional: He is oriented to person, place, and time. He appears well-developed and well-nourished. No distress.  HENT:  Head: Normocephalic and atraumatic.  Eyes: Conjunctivae and EOM are normal.  Neck: Neck supple. No tracheal deviation present.  Cardiovascular: Normal rate, regular rhythm and normal heart sounds.  Exam reveals no friction rub.   No murmur  heard. Pulmonary/Chest: Effort normal and breath sounds normal. No respiratory distress. He exhibits tenderness (left).  Musculoskeletal: Normal range of motion.  Neurological: He is alert and oriented to person, place, and time.  Skin: Skin is warm and dry.  Psychiatric: He has a normal mood and affect. His behavior is normal.  Nursing note and vitals reviewed.   ED Course  Procedures (including critical care time) DIAGNOSTIC STUDIES: Oxygen Saturation is 96% on RA, nl by my interpretation.    COORDINATION OF CARE: 1:48 AM-Discussed treatment plan with pt at bedside and pt agreed to plan.   Labs Review Labs Reviewed  CBC WITH DIFFERENTIAL - Abnormal; Notable for the following:    Platelets 98 (*)    All other components within normal limits  BRAIN NATRIURETIC PEPTIDE  BASIC METABOLIC PANEL  I-STAT TROPOININ, ED    Imaging Review Dg Chest 2 View  10/30/2014   CLINICAL DATA:  Left-sided chest pain  EXAM: CHEST  2 VIEW  COMPARISON:  04/22/2011  FINDINGS: Normal heart size and mediastinal contours. No acute infiltrate or edema. No effusion or pneumothorax. No acute osseous findings.  IMPRESSION: No active cardiopulmonary disease.   Electronically Signed   By: Tiburcio PeaJonathan  Watts M.D.   On: 10/30/2014 00:56     EKG Interpretation   Date/Time:  Monday October 29 2014 22:55:48 EST Ventricular Rate:  71 PR Interval:  170 QRS Duration: 110 QT Interval:  386 QTC Calculation: 419 R Axis:   53 Text Interpretation:  Normal sinus rhythm Incomplete right bundle branch  block No significant change since last tracing Confirmed by Anitra LauthPLUNKETT  MD,  Alphonzo LemmingsWHITNEY ([id number]) on 10/30/2014 1:39:28 AM      MDM   Final diagnoses:  Chest wall pain    Pt with atypical story for CP that is pleuritic and reproducible on exam.  Only risk factor is HTN and HEART score of 1.  PERC neg.  EKG unchanged.  CXR, CBC, BMP, trop are all normal.  Delta trop done now 7 hours after pain started is also normal.   Feel most likely chest wall pain.  No signs of pericarditis, low suspicion for PE, cardiac cause or GI component. 2:47 AM Repeat trop neg.  Will d/c home.   I personally performed the services described in this documentation, which was scribed in my presence.  The recorded information has been reviewed and considered. e   Christopher SproutWhitney Valaree Fresquez, MD 10/30/14 [phone]

## 2014-10-30 NOTE — Discharge Instructions (Signed)

## 2014-10-30 NOTE — ED Notes (Signed)
Patient states he takes aspirin 81mg  daily each morning, and says it has been 24 hours since his last dose.

## 2016-01-31 ENCOUNTER — Emergency Department (HOSPITAL_BASED_OUTPATIENT_CLINIC_OR_DEPARTMENT_OTHER): Payer: Self-pay

## 2016-01-31 ENCOUNTER — Emergency Department (HOSPITAL_BASED_OUTPATIENT_CLINIC_OR_DEPARTMENT_OTHER)
Admission: EM | Admit: 2016-01-31 | Discharge: 2016-01-31 | Disposition: A | Discharge status: Home / Self Care | Payer: Self-pay | Attending: Emergency Medicine | Admitting: Emergency Medicine

## 2016-01-31 ENCOUNTER — Encounter (HOSPITAL_BASED_OUTPATIENT_CLINIC_OR_DEPARTMENT_OTHER): Payer: Self-pay | Admitting: *Deleted

## 2016-01-31 DIAGNOSIS — R42 Dizziness and giddiness: Secondary | ICD-10-CM | POA: Insufficient documentation

## 2016-01-31 DIAGNOSIS — R200 Anesthesia of skin: Secondary | ICD-10-CM | POA: Insufficient documentation

## 2016-01-31 DIAGNOSIS — Z7982 Long term (current) use of aspirin: Secondary | ICD-10-CM | POA: Insufficient documentation

## 2016-01-31 DIAGNOSIS — F419 Anxiety disorder, unspecified: Secondary | ICD-10-CM | POA: Insufficient documentation

## 2016-01-31 DIAGNOSIS — F17210 Nicotine dependence, cigarettes, uncomplicated: Secondary | ICD-10-CM | POA: Insufficient documentation

## 2016-01-31 DIAGNOSIS — G478 Other sleep disorders: Secondary | ICD-10-CM | POA: Insufficient documentation

## 2016-01-31 DIAGNOSIS — Z79899 Other long term (current) drug therapy: Secondary | ICD-10-CM | POA: Insufficient documentation

## 2016-01-31 DIAGNOSIS — Z8719 Personal history of other diseases of the digestive system: Secondary | ICD-10-CM | POA: Insufficient documentation

## 2016-01-31 DIAGNOSIS — I10 Essential (primary) hypertension: Secondary | ICD-10-CM | POA: Insufficient documentation

## 2016-01-31 DIAGNOSIS — B349 Viral infection, unspecified: Secondary | ICD-10-CM | POA: Insufficient documentation

## 2016-01-31 HISTORY — DX: Anxiety disorder, unspecified: F41.9

## 2016-01-31 LAB — COMPREHENSIVE METABOLIC PANEL
ALT: 50 U/L (ref 17–63)
ANION GAP: 7 (ref 5–15)
AST: 32 U/L (ref 15–41)
Albumin: 3.9 g/dL (ref 3.5–5.0)
Alkaline Phosphatase: 52 U/L (ref 38–126)
BILIRUBIN TOTAL: 0.6 mg/dL (ref 0.3–1.2)
BUN: 10 mg/dL (ref 6–20)
CHLORIDE: 106 mmol/L (ref 101–111)
CO2: 27 mmol/L (ref 22–32)
Calcium: 8.7 mg/dL — ABNORMAL LOW (ref 8.9–10.3)
Creatinine, Ser: 0.91 mg/dL (ref 0.61–1.24)
GFR calc Af Amer: >60 mL/min (ref >60)
Glucose, Bld: 161 mg/dL — ABNORMAL HIGH (ref 65–99)
POTASSIUM: 3 mmol/L — AB (ref 3.5–5.1)
Sodium: 140 mmol/L (ref 135–145)
TOTAL PROTEIN: 6.8 g/dL (ref 6.5–8.1)

## 2016-01-31 LAB — LIPASE, BLOOD: LIPASE: 40 U/L (ref 11–51)

## 2016-01-31 LAB — CBC WITH DIFFERENTIAL/PLATELET
BASOS ABS: 0 10*3/uL (ref 0.0–0.1)
BASOS PCT: 0 %
Eosinophils Absolute: 0.3 10*3/uL (ref 0.0–0.7)
Eosinophils Relative: 4 %
HEMATOCRIT: 42.8 % (ref 39.0–52.0)
HEMOGLOBIN: 15.1 g/dL (ref 13.0–17.0)
Lymphocytes Relative: 21 %
Lymphs Abs: 1.3 10*3/uL (ref 0.7–4.0)
MCH: 30.8 pg (ref 26.0–34.0)
MCHC: 35.3 g/dL (ref 30.0–36.0)
MCV: 87.3 fL (ref 78.0–100.0)
Monocytes Absolute: 0.8 10*3/uL (ref 0.1–1.0)
Monocytes Relative: 13 %
NEUTROS ABS: 4 10*3/uL (ref 1.7–7.7)
NEUTROS PCT: 62 %
Platelets: 94 10*3/uL — ABNORMAL LOW (ref 150–400)
RBC: 4.9 MIL/uL (ref 4.22–5.81)
RDW: 12.3 % (ref 11.5–15.5)
WBC: 6.5 10*3/uL (ref 4.0–10.5)

## 2016-01-31 MED ORDER — MECLIZINE HCL 25 MG PO TABS: 25.0000 mg | ORAL_TABLET | Freq: Three times a day (TID) | ORAL | Status: DC | PRN

## 2016-01-31 MED ORDER — SODIUM CHLORIDE 0.9 % IV SOLN
INTRAVENOUS | Status: DC
  Administered 2016-01-31: 14:00:00 via INTRAVENOUS

## 2016-01-31 MED ORDER — MECLIZINE HCL 25 MG PO TABS
25.0000 mg | ORAL_TABLET | Freq: Once | ORAL | Status: AC
Start: 2016-01-31 — End: 2016-01-31
  Administered 2016-01-31: 25 mg via ORAL
  Filled 2016-01-31: qty 1

## 2016-01-31 MED ORDER — ONDANSETRON HCL 4 MG/2ML IJ SOLN
4.0000 mg | Freq: Once | INTRAMUSCULAR | Status: AC
  Administered 2016-01-31: 4 mg via INTRAVENOUS
  Filled 2016-01-31: qty 2

## 2016-01-31 MED ORDER — SODIUM CHLORIDE 0.9 % IV BOLUS (SEPSIS)
1000.0000 mL | Freq: Once | INTRAVENOUS | Status: AC
  Administered 2016-01-31: 1000 mL via INTRAVENOUS

## 2016-01-31 MED ORDER — POTASSIUM CHLORIDE CRYS ER 20 MEQ PO TBCR
40.0000 meq | EXTENDED_RELEASE_TABLET | Freq: Once | ORAL | Status: AC
  Administered 2016-01-31: 40 meq via ORAL
  Filled 2016-01-31: qty 2

## 2016-01-31 NOTE — ED Provider Notes (Addendum)
CSN: [id number]     Arrival date & time 01/31/16  1101 History   First MD Initiated Contact with Patient 01/31/16 1114     Chief Complaint  Patient presents with  . Hypertension     (Consider location/radiation/quality/duration/timing/severity/associated sxs/prior Treatment) Patient is a 45 y.o. male presenting with hypertension. The history is provided by the patient.  Hypertension Pertinent negatives include no chest pain and no shortness of breath.  Patient with onset of lightheadedness slight room spinning fatigue tingling in both hands and feet not feeling right since Tuesday. No nausea vomiting or diarrhea. No fevers. No upper respiratory symptoms. No history of anything similar to this.  The symptoms and made the patient feel anxious.Due to symptoms patient's blood pressure was checked at work and it was high.    Past Medical History  Diagnosis Date  . Hernia   . Hypertension   . Anxiety    Past Surgical History  Procedure Laterality Date  . Neck surgery    . Wrist surgery     History reviewed. No pertinent family history. Social History  Substance Use Topics  . Smoking status: Current Every Day Smoker    Types: Cigarettes  . Smokeless tobacco: None  . Alcohol Use: Yes    Review of Systems  Constitutional: Positive for fatigue. Negative for fever.  HENT: Negative for congestion.   Eyes: Negative for visual disturbance.  Respiratory: Negative for shortness of breath.   Cardiovascular: Negative for chest pain.  Gastrointestinal: Negative for nausea, vomiting and diarrhea.  Musculoskeletal: Negative for myalgias, back pain and neck pain.  Skin: Negative for rash.  Neurological: Positive for dizziness, light-headedness and numbness. Negative for weakness.  Hematological: Does not bruise/bleed easily.  Psychiatric/Behavioral: Positive for sleep disturbance. Negative for confusion. The patient is nervous/anxious.       Allergies  Review of patient's allergies  indicates no known allergies.  Home Medications   Prior to Admission medications   Medication Sig Start Date End Date Taking? Authorizing Provider  albuterol (PROVENTIL HFA;VENTOLIN HFA) 108 (90 BASE) MCG/ACT inhaler Inhale 2 puffs into the lungs every 6 (six) hours as needed. For shortness of breath    Historical Provider, MD  ALPRAZolam Prudy Feeler(XANAX) 1 MG tablet Take 1 mg by mouth 3 (three) times daily as needed. For anxiety    Historical Provider, MD  aspirin EC 81 MG tablet Take 81 mg by mouth daily.    Historical Provider, MD  meclizine (ANTIVERT) 25 MG tablet Take 1 tablet (25 mg total) by mouth 3 (three) times daily as needed for dizziness. 01/31/16   Vanetta MuldersScott Colvin Blatt, MD  Multiple Vitamin (MULTIVITAMIN WITH MINERALS) TABS Take 1 tablet by mouth daily.    Historical Provider, MD  triamterene-hydrochlorothiazide (MAXZIDE-25) 37.5-25 MG per tablet Take 1 tablet by mouth daily.    Historical Provider, MD   BP 127/84 mmHg  Pulse 74  Temp(Src) 98.4 F (36.9 C) (Oral)  Resp 17  Ht 5\' 10"  (1.778 m)  Wt 104.327 kg  BMI 33.00 kg/m2  SpO2 96% Physical Exam  Constitutional: He is oriented to person, place, and time. He appears well-developed and well-nourished. No distress.  HENT:  Head: Atraumatic.  Mouth/Throat: Oropharynx is clear and moist.  Eyes: Conjunctivae and EOM are normal. Pupils are equal, round, and reactive to light.  Neck: Normal range of motion. Neck supple.  Cardiovascular: Normal rate, regular rhythm and normal heart sounds.   No murmur heard. Pulmonary/Chest: Effort normal and breath sounds normal. No respiratory distress.  Abdominal: Soft. Bowel sounds are normal. There is no tenderness.  Musculoskeletal: Normal range of motion.  Neurological: He is alert and oriented to person, place, and time. No cranial nerve deficit. He exhibits normal muscle tone. Coordination normal.  Other than some reproducible vertigo with range of motion of the eyes.  Nursing note and vitals  reviewed.   ED Course  Procedures (including critical care time) Labs Review Labs Reviewed  COMPREHENSIVE METABOLIC PANEL - Abnormal; Notable for the following:    Potassium 3.0 (*)    Glucose, Bld 161 (*)    Calcium 8.7 (*)    All other components within normal limits  CBC WITH DIFFERENTIAL/PLATELET - Abnormal; Notable for the following:    Platelets 94 (*)    All other components within normal limits  LIPASE, BLOOD  CBC WITH DIFFERENTIAL/PLATELET  CBC WITH DIFFERENTIAL/PLATELET   Results for orders placed or performed during the hospital encounter of 01/31/16  Comprehensive metabolic panel  Result Value Ref Range   Sodium 140 135 - 145 mmol/L   Potassium 3.0 (L) 3.5 - 5.1 mmol/L   Chloride 106 101 - 111 mmol/L   CO2 27 22 - 32 mmol/L   Glucose, Bld 161 (H) 65 - 99 mg/dL   BUN 10 6 - 20 mg/dL   Creatinine, Ser 4.09 0.61 - 1.24 mg/dL   Calcium 8.7 (L) 8.9 - 10.3 mg/dL   Total Protein 6.8 6.5 - 8.1 g/dL   Albumin 3.9 3.5 - 5.0 g/dL   AST 32 15 - 41 U/L   ALT 50 17 - 63 U/L   Alkaline Phosphatase 52 38 - 126 U/L   Total Bilirubin 0.6 0.3 - 1.2 mg/dL   GFR calc non Af Amer >60 >60 mL/min   GFR calc Af Amer >60 >60 mL/min   Anion gap 7 5 - 15  Lipase, blood  Result Value Ref Range   Lipase 40 11 - 51 U/L  CBC with Differential  Result Value Ref Range   WBC 6.5 4.0 - 10.5 K/uL   RBC 4.90 4.22 - 5.81 MIL/uL   Hemoglobin 15.1 13.0 - 17.0 g/dL   HCT 81.1 91.4 - 78.2 %   MCV 87.3 78.0 - 100.0 fL   MCH 30.8 26.0 - 34.0 pg   MCHC 35.3 30.0 - 36.0 g/dL   RDW 95.6 21.3 - 08.6 %   Platelets 94 (L) 150 - 400 K/uL   Neutrophils Relative % 62 %   Neutro Abs 4.0 1.7 - 7.7 K/uL   Lymphocytes Relative 21 %   Lymphs Abs 1.3 0.7 - 4.0 K/uL   Monocytes Relative 13 %   Monocytes Absolute 0.8 0.1 - 1.0 K/uL   Eosinophils Relative 4 %   Eosinophils Absolute 0.3 0.0 - 0.7 K/uL   Basophils Relative 0 %   Basophils Absolute 0.0 0.0 - 0.1 K/uL     Imaging Review Dg Chest 2  View  01/31/2016  CLINICAL DATA:  Left side chest pain starting today, increased blood pressure EXAM: CHEST  2 VIEW COMPARISON:  10/29/2014 FINDINGS: The heart size and mediastinal contours are within normal limits. Both lungs are clear. The visualized skeletal structures are unremarkable. IMPRESSION: No active cardiopulmonary disease. Electronically Signed   By: Natasha Mead M.D.   On: 01/31/2016 12:32   Ct Head Wo Contrast  01/31/2016  CLINICAL DATA:  Vertigo, lethargy for 3 days elevated blood pressure EXAM: CT HEAD WITHOUT CONTRAST TECHNIQUE: Contiguous axial images were obtained from the base of  the skull through the vertex without intravenous contrast. COMPARISON:  None. FINDINGS: Brain: No intracranial hemorrhage, mass effect or midline shift. No acute cortical infarction. No mass lesion is noted on this unenhanced scan. No intra or extra-axial fluid collection. Vascular: No hyperdense vessel or unexpected calcification. Skull: Negative for fracture or focal lesion. Sinuses/Orbits: No acute findings. Other: None. IMPRESSION: No acute intracranial abnormality. Electronically Signed   By: Natasha Mead M.D.   On: 01/31/2016 12:31   I have personally reviewed and evaluated these images and lab results as part of my medical decision-making.   EKG Interpretation   Date/Time:  Friday January 31 2016 12:06:28 EDT Ventricular Rate:  82 PR Interval:  162 QRS Duration: 116 QT Interval:  379 QTC Calculation: 443 R Axis:   14 Text Interpretation:  Sinus rhythm Nonspecific intraventricular conduction  delay Inferior infarct, old Confirmed by Skyy Mcknight  MD, Asli Tokarski [phone]) on  01/31/2016 12:16:12 PM      MDM   Final diagnoses:  Viral illness  Vertigo    Patient with a constellation of symptoms main one not being some dizziness and vertigo. Extensive workup here without any significant findings other than some mild hypokalemia. Suspect since symptoms been ongoing since Tuesday that this is a viral  process. Patient's head CT was negative chest x-ray was negative EKG without any significant abnormalities. Labs as stated without any significant abnormalities other than the hypokalemia. Patient treated with 40 mg of potassium orally here. Patient will be treated with Antivert. Patient's symptoms improved significantly with Antivert. Patient has primary care doctor to follow-up with if symptoms persist. Work note provided.  Patients one concern was his blood pressure was high. That was essentially self correcting here without any specific treatment. Most recent blood pressure was 127/84.   Vanetta Mulders, MD 01/31/16 1348  Vanetta Mulders, MD 01/31/16 1349

## 2016-01-31 NOTE — ED Notes (Signed)
Pt taken to xr/ct 

## 2016-01-31 NOTE — Discharge Instructions (Signed)
Benign Positional Vertigo °Vertigo is the feeling that you or your surroundings are moving when they are not. Benign positional vertigo is the most common form of vertigo. The cause of this condition is not serious (is benign). This condition is triggered by certain movements and positions (is positional). This condition can be dangerous if it occurs while you are doing something that could endanger you or others, such as driving.  °CAUSES °In many cases, the cause of this condition is not known. It may be caused by a disturbance in an area of the inner ear that helps your brain to sense movement and balance. This disturbance can be caused by a viral infection (labyrinthitis), head injury, or repetitive motion. °RISK FACTORS °This condition is more likely to develop in: °· Women. °· People who are 50 years of age or older. °SYMPTOMS °Symptoms of this condition usually happen when you move your head or your eyes in different directions. Symptoms may start suddenly, and they usually last for less than a minute. Symptoms may include: °· Loss of balance and falling. °· Feeling like you are spinning or moving. °· Feeling like your surroundings are spinning or moving. °· Nausea and vomiting. °· Blurred vision. °· Dizziness. °· Involuntary eye movement (nystagmus). °Symptoms can be mild and cause only slight annoyance, or they can be severe and interfere with daily life. Episodes of benign positional vertigo may return (recur) over time, and they may be triggered by certain movements. Symptoms may improve over time. °DIAGNOSIS °This condition is usually diagnosed by medical history and a physical exam of the head, neck, and ears. You may be referred to a health care provider who specializes in ear, nose, and throat (ENT) problems (otolaryngologist) or a provider who specializes in disorders of the nervous system (neurologist). You may have additional testing, including: °· MRI. °· A CT scan. °· Eye movement tests. Your  health care provider may ask you to change positions quickly while he or she watches you for symptoms of benign positional vertigo, such as nystagmus. Eye movement may be tested with an electronystagmogram (ENG), caloric stimulation, the Dix-Hallpike test, or the roll test. °· An electroencephalogram (EEG). This records electrical activity in your brain. °· Hearing tests. °TREATMENT °Usually, your health care provider will treat this by moving your head in specific positions to adjust your inner ear back to normal. Surgery may be needed in severe cases, but this is rare. In some cases, benign positional vertigo may resolve on its own in 2-4 weeks. °HOME CARE INSTRUCTIONS °Safety °· Move slowly. Avoid sudden body or head movements. °· Avoid driving. °· Avoid operating heavy machinery. °· Avoid doing any tasks that would be dangerous to you or others if a vertigo episode would occur. °· If you have trouble walking or keeping your balance, try using a cane for stability. If you feel dizzy or unstable, sit down right away. °· Return to your normal activities as told by your health care provider. Ask your health care provider what activities are safe for you. °General Instructions °· Take over-the-counter and prescription medicines only as told by your health care provider. °· Avoid certain positions or movements as told by your health care provider. °· Drink enough fluid to keep your urine clear or pale yellow. °· Keep all follow-up visits as told by your health care provider. This is important. °SEEK MEDICAL CARE IF: °· You have a fever. °· Your condition gets worse or you develop new symptoms. °· Your family or friends   notice any behavioral changes.  Your nausea or vomiting gets worse.  You have numbness or a "pins and needles" sensation. SEEK IMMEDIATE MEDICAL CARE IF:  You have difficulty speaking or moving.  You are always dizzy.  You faint.  You develop severe headaches.  You have weakness in your  legs or arms.  You have changes in your hearing or vision.  You develop a stiff neck.  You develop sensitivity to light.   This information is not intended to replace advice given to you by your health care provider. Make sure you discuss any questions you have with your health care provider.   Workup for the symptoms without any acute findings. As we discussed most likely a viral process. Take the Antivert as directed. Work note provided to be out of work over the weekend. If symptoms persist follow-up with your regular doctor. Return for any new or worse symptoms.   Document Released: 07/27/2006 Document Revised: 07/10/2015 Document Reviewed: 02/11/2015 Elsevier Interactive Patient Education 2016 [employer]  Cardiac Event Monitoring A cardiac event monitor is a small recording device used to help detect abnormal heart rhythms (arrhythmias). The monitor is used to record heart rhythm when noticeable symptoms such as the following occur:  Fast heartbeats (palpitations), such as heart racing or fluttering.  Dizziness.  Fainting or light-headedness.  Unexplained weakness. The monitor is wired to two electrodes placed on your chest. Electrodes are flat, sticky disks that attach to your skin. The monitor can be worn for up to 30 days. You will wear the monitor at all times, except when bathing.  HOW TO USE YOUR CARDIAC EVENT MONITOR A technician will prepare your chest for the electrode placement. The technician will show you how to place the electrodes, how to work the monitor, and how to replace the batteries. Take time to practice using the monitor before you leave the office. Make sure you understand how to send the information from the monitor to your health care provider. This requires a telephone with a landline, not a cell phone. You need to:  Wear your monitor at all times, except when you are in water:  Do not get the monitor wet.  Take the monitor off when bathing. Do not  swim or use a hot tub with it on.  Keep your skin clean. Do not put body lotion or moisturizer on your chest.  Change the electrodes daily or any time they stop sticking to your skin. You might need to use tape to keep them on.  It is possible that your skin under the electrodes could become irritated. To keep this from happening, try to put the electrodes in slightly different places on your chest. However, they must remain in the area under your left breast and in the upper right section of your chest.  Make sure the monitor is safely clipped to your clothing or in a location close to your body that your health care provider recommends.  Press the button to record when you feel symptoms of heart trouble, such as dizziness, weakness, light-headedness, palpitations, thumping, shortness of breath, unexplained weakness, or a fluttering or racing heart. The monitor is always on and records what happened slightly before you pressed the button, so do not worry about being too late to get good information.  Keep a diary of your activities, such as walking, doing chores, and taking medicine. It is especially important to note what you were doing when you pushed the button to record your symptoms.  This will help your health care provider determine what might be contributing to your symptoms. The information stored in your monitor will be reviewed by your health care provider alongside your diary entries.  Send the recorded information as recommended by your health care provider. It is important to understand that it will take some time for your health care provider to process the results.  Change the batteries as recommended by your health care provider. SEEK IMMEDIATE MEDICAL CARE IF:   You have chest pain.  You have extreme difficulty breathing or shortness of breath.  You develop a very fast heartbeat that persists.  You develop dizziness that does not go away.  You faint or constantly feel you  are about to faint.   This information is not intended to replace advice given to you by your health care provider. Make sure you discuss any questions you have with your health care provider.   Document Released: 07/28/2008 Document Revised: 11/09/2014 Document Reviewed: 04/17/2013 Elsevier Interactive Patient Education [employer]

## 2016-01-31 NOTE — ED Notes (Signed)
Pt reports HTN, reports lethargy over the past 3 days.  Reports that his fingertips have been going numb x 3-4 days.  Reports increased stressed lately.  Hx of anxiety-reports increased anxiety x 3-4 days.

## 2016-05-12 ENCOUNTER — Emergency Department (HOSPITAL_BASED_OUTPATIENT_CLINIC_OR_DEPARTMENT_OTHER)
Admission: EM | Admit: 2016-05-12 | Discharge: 2016-05-12 | Disposition: A | Discharge status: Home / Self Care | Payer: Self-pay | Attending: Emergency Medicine | Admitting: Emergency Medicine

## 2016-05-12 ENCOUNTER — Encounter (HOSPITAL_BASED_OUTPATIENT_CLINIC_OR_DEPARTMENT_OTHER): Payer: Self-pay | Admitting: Emergency Medicine

## 2016-05-12 DIAGNOSIS — F17210 Nicotine dependence, cigarettes, uncomplicated: Secondary | ICD-10-CM | POA: Insufficient documentation

## 2016-05-12 DIAGNOSIS — S40862A Insect bite (nonvenomous) of left upper arm, initial encounter: Secondary | ICD-10-CM | POA: Insufficient documentation

## 2016-05-12 DIAGNOSIS — R05 Cough: Secondary | ICD-10-CM | POA: Insufficient documentation

## 2016-05-12 DIAGNOSIS — T63444A Toxic effect of venom of bees, undetermined, initial encounter: Secondary | ICD-10-CM

## 2016-05-12 DIAGNOSIS — S40861A Insect bite (nonvenomous) of right upper arm, initial encounter: Secondary | ICD-10-CM | POA: Insufficient documentation

## 2016-05-12 DIAGNOSIS — Y939 Activity, unspecified: Secondary | ICD-10-CM | POA: Insufficient documentation

## 2016-05-12 DIAGNOSIS — W57XXXA Bitten or stung by nonvenomous insect and other nonvenomous arthropods, initial encounter: Secondary | ICD-10-CM | POA: Insufficient documentation

## 2016-05-12 DIAGNOSIS — S0086XA Insect bite (nonvenomous) of other part of head, initial encounter: Secondary | ICD-10-CM | POA: Insufficient documentation

## 2016-05-12 DIAGNOSIS — Y999 Unspecified external cause status: Secondary | ICD-10-CM | POA: Insufficient documentation

## 2016-05-12 DIAGNOSIS — I10 Essential (primary) hypertension: Secondary | ICD-10-CM | POA: Insufficient documentation

## 2016-05-12 DIAGNOSIS — Y929 Unspecified place or not applicable: Secondary | ICD-10-CM | POA: Insufficient documentation

## 2016-05-12 MED ORDER — DEXAMETHASONE 6 MG PO TABS
10.0000 mg | ORAL_TABLET | Freq: Once | ORAL | Status: AC
  Administered 2016-05-12: 10 mg via ORAL
  Filled 2016-05-12: qty 1

## 2016-05-12 NOTE — ED Notes (Signed)
Pt reports 13-15 bee stings from yellow jackets yesterday afternoon. Pt reports pain in left arm and some shob last night. Pt reports general fatigue today. Has been taking benadryl at home last dose at 1430 today. Ibuprofen last dose at 1700 today.

## 2016-05-12 NOTE — ED Provider Notes (Addendum)
CSN: [id number]     Arrival date & time 05/12/16  1909 History  By signing my name below, I, Rosario Adie, attest that this documentation has been prepared under the direction and in the presence of Melene Plan, DO.  Electronically Signed: Rosario Adie, ED Scribe. 05/12/2016. 7:51 PM.   Chief Complaint  Patient presents with  . Insect Bite   Patient is a 45 y.o. male presenting with general illness. The history is provided by the patient. No language interpreter was used.  Illness Location:  Upper left and right arms, behind jaw Quality:  Burning Severity:  Moderate Onset quality:  Sudden Duration:  1 day Timing:  Constant Progression:  Unchanged Chronicity:  New Context:  Yellow jacket stings Relieved by:  Nothing Worsened by:  Palpation to the stings Ineffective treatments:  Benedryl and Ibuprofen Associated symptoms: cough (dry) and myalgias (diffuse)   Associated symptoms: no abdominal pain, no chest pain, no congestion, no diarrhea, no fever, no headaches, no nausea, no rash, no shortness of breath, no sore throat, no vomiting and no wheezing   Myalgias:    Location:  Generalized   Quality:  Burning and aching   Severity:  Moderate   Onset quality:  Gradual   Duration:  1 day   Timing:  Constant   Progression:  Unchanged  HPI Comments: LOVIE AGRESTA is a 45 y.o. male who presents to the Emergency Department complaining of several, swollen, burning bee stings to his bilateral upper arms and behind the jaw that occurred 1 day ago. Pt reports that he was working outside when he was stung 13-15 times by yellow jackets. He notes that he has had an associated dry cough and generalized myalgias since the stings occurred. Pt reports that the swelling to the areas has gone down. He has been taking Ibuprofen and Benadryl since the bee stings occurred, with no noted relief of his symptoms. Pt is not complaining tongue swelling, throat swelling, trouble breathing,  vomiting, diarrhea or any other symptoms.  Past Medical History  Diagnosis Date  . Hernia   . Hypertension   . Anxiety    Past Surgical History  Procedure Laterality Date  . Neck surgery    . Wrist surgery     No family history on file. Social History  Substance Use Topics  . Smoking status: Current Every Day Smoker    Types: Cigarettes  . Smokeless tobacco: None  . Alcohol Use: Yes    Review of Systems  Constitutional: Negative for fever and chills.  HENT: Negative for congestion, facial swelling and sore throat.   Eyes: Negative for discharge and visual disturbance.  Respiratory: Positive for cough (dry). Negative for shortness of breath and wheezing.   Cardiovascular: Negative for chest pain and palpitations.  Gastrointestinal: Negative for nausea, vomiting, abdominal pain and diarrhea.  Musculoskeletal: Positive for myalgias (diffuse). Negative for arthralgias.  Skin: Negative for color change and rash.  Neurological: Negative for tremors, syncope and headaches.  Psychiatric/Behavioral: Negative for confusion and dysphoric mood.   Allergies  Review of patient's allergies indicates no known allergies.  Home Medications   Prior to Admission medications   Medication Sig Start Date End Date Taking? Authorizing Provider  albuterol (PROVENTIL HFA;VENTOLIN HFA) 108 (90 BASE) MCG/ACT inhaler Inhale 2 puffs into the lungs every 6 (six) hours as needed. For shortness of breath    Historical Provider, MD  ALPRAZolam Prudy Feeler) 1 MG tablet Take 1 mg by mouth 3 (three) times daily as  needed. For anxiety    Historical Provider, MD  aspirin EC 81 MG tablet Take 81 mg by mouth daily.    Historical Provider, MD  meclizine (ANTIVERT) 25 MG tablet Take 1 tablet (25 mg total) by mouth 3 (three) times daily as needed for dizziness. 01/31/16   Vanetta MuldersScott Zackowski, MD  Multiple Vitamin (MULTIVITAMIN WITH MINERALS) TABS Take 1 tablet by mouth daily.    Historical Provider, MD   triamterene-hydrochlorothiazide (MAXZIDE-25) 37.5-25 MG per tablet Take 1 tablet by mouth daily.    Historical Provider, MD   BP 118/80 mmHg  Pulse 95  Temp(Src) 98.5 F (36.9 C) (Oral)  Resp 18  Ht 5\' 10"  (1.778 m)  Wt 230 lb (104.327 kg)  BMI 33.00 kg/m2  SpO2 95%   Physical Exam  Constitutional: He is oriented to person, place, and time. He appears well-developed and well-nourished. No distress.  Well appearing and non-toxic.  HENT:  Head: Normocephalic.  Eyes: EOM are normal. Pupils are equal, round, and reactive to light.  Neck: Normal range of motion.  Cardiovascular: Normal rate, regular rhythm and normal heart sounds.  Exam reveals no gallop and no friction rub.   No murmur heard. Pulmonary/Chest: Effort normal and breath sounds normal. No respiratory distress. He has no wheezes. He has no rales.  Lungs CTA bilaterally.  Musculoskeletal: Normal range of motion.  Neurological: He is alert and oriented to person, place, and time.  Skin: Skin is warm and dry. There is erythema.  Multiple localized areas of erythema mostly to the right upper and left upper arm, and one just behind the angle of the jaw.   Psychiatric: He has a normal mood and affect. His behavior is normal.  Nursing note and vitals reviewed.  ED Course  Procedures (including critical care time)  DIAGNOSTIC STUDIES: Oxygen Saturation is 95% on RA, adequate by my interpretation.   COORDINATION OF CARE: 7:28 PM-Discussed next steps with pt including a dose of steroids and continued at home and care monitoring. Pt verbalized understanding and is agreeable with the plan.   MDM   Final diagnoses:  Bee sting, undetermined intent, initial encounter    45 yo M Who was stung by multiple bees a couple days ago comes in with pain to the area. Has been taking ibuprofen and Benadryl with minimal relief. On exam patient with no signs of anaphylaxis. Seem to be localized reaction. Will the patient continue his  current therapy. Given a dose of steroids to help with the systemic reaction. PCP follow-up. I personally performed the services described in this documentation, which was scribed in my presence. The recorded information has been reviewed and is accurate.  8:55 PM:  I have discussed the diagnosis/risks/treatment options with the patient and family and believe the pt to be eligible for discharge home to follow-up with PCP. We also discussed returning to the ED immediately if new or worsening sx occur. We discussed the sx which are most concerning (e.g., sudden worsening pain, fever, inability to tolerate by mouth) that necessitate immediate return. Medications administered to the patient during their visit and any new prescriptions provided to the patient are listed below.  Medications given during this visit Medications  dexamethasone (DECADRON) tablet 10 mg (10 mg Oral Given 05/12/16 1932)    Discharge Medication List as of 05/12/2016  7:30 PM      The patient appears reasonably screen and/or stabilized for discharge and I doubt any other medical condition or other [facility] requiring further screening, evaluation,  or treatment in the ED at this time prior to discharge.     Melene Plan, DO 05/12/16 2055  Melene Plan, DO 05/12/16 2055

## 2016-05-12 NOTE — Discharge Instructions (Signed)
Take 4 over the counter ibuprofen tablets 3 times a day or 2 over-the-counter naproxen tablets twice a day for pain. Also take tylenol 1046m(2 extra strength) four times a day.  You can try zantac twice a day. Continue to take benadryl 4 times a day.  Bee, Wasp, or [employer] wasps, and hornets are part of a family of insects that can sting people. These stings can cause pain and inflammation, but they are usually not serious. However, some people may have an allergic reaction to a sting. This can cause the symptoms to be more severe.  SYMPTOMS  Common symptoms of this condition include:   A red lump in the skin that sometimes has a tiny hole in the center. In some cases, a stinger may be in the center of the wound.  Pain and itching at the sting site.  Redness and swelling around the sting site. If you have an allergic reaction (localized allergic reaction), the swelling and redness may spread out from the sting site. In some cases, this reaction can continue to develop over the next 12-36 hours. In rare cases, a person may have a severe allergic reaction (anaphylactic reaction) to a sting. Symptoms of an anaphylactic reaction may include:   Wheezing or difficulty breathing.  Raised, itchy, red patches on the skin.  Nausea or vomiting.  Abdominal cramping.  Diarrhea.  Chest pain.  Fainting.  Redness of the face (flushing). DIAGNOSIS  This condition is usually diagnosed based on symptoms, medical history, and a physical exam. TREATMENT  Most stings can be treated with:   Icing to reduce swelling.  Medicines (antihistamines) to treat itching or an allergic reaction.  Medicines to help reduce pain. These may be medicines that you take by mouth, or medicated creams or lotions that you apply to your skin. If you were stung by a bee, the stinger and a small sac of poison may be in the wound. This may be removed by brushing across it with a flat card, such as a credit card.  Another method is to pinch the area and pull it out. These methods can help reduce the severity of the body's reaction to the sting.  HOME CARE INSTRUCTIONS   Wash the sting site daily with soap and water as told by your health care provider.  Apply or take over-the-counter and prescription medicines only as told by your health care provider.  If directed, apply ice to the sting area.  Put ice in a plastic bag.  Place a towel between your skin and the bag.  Leave the ice on for 20 minutes, 2-3 times per day.  Do not scratch the sting area.  To lessen pain, try using a paste that is made of water and baking soda. Rub the paste on the sting area and leave it on for 5 minutes.  If you had a severe allergic reaction to a sting, you may need:  To wear a medical bracelet or necklace that lists the allergy.  To learn when and how to use an anaphylaxis kit or epinephrine injection. Your family members may also need to learn this.  To carry an anaphylaxis kit with you at all times. SEEK MEDICAL CARE IF:   Your symptoms do not get better in 2-3 days.  You have redness, swelling, or pain that spreads beyond the area of the sting.  You have a fever. SEEK IMMEDIATE MEDICAL CARE IF:  You have symptoms of a severe allergic reaction. These include:  Wheezing or difficulty breathing.  Chest pain.  Light-headedness or fainting.  Itchy, raised, red patches on the skin.  Nausea or vomiting.  Abdominal cramping.  Diarrhea.   This information is not intended to replace advice given to you by your health care provider. Make sure you discuss any questions you have with your health care provider.   Document Released: 10/19/2005 Document Revised: 07/10/2015 Document Reviewed: 03/06/2015 Elsevier Interactive Patient Education [employer].

## 2016-11-02 HISTORY — PX: EYE SURGERY: SHX253

## 2017-01-06 ENCOUNTER — Encounter (HOSPITAL_BASED_OUTPATIENT_CLINIC_OR_DEPARTMENT_OTHER): Payer: Self-pay | Admitting: Emergency Medicine

## 2017-01-06 ENCOUNTER — Emergency Department (HOSPITAL_BASED_OUTPATIENT_CLINIC_OR_DEPARTMENT_OTHER): Payer: Self-pay

## 2017-01-06 ENCOUNTER — Emergency Department (HOSPITAL_BASED_OUTPATIENT_CLINIC_OR_DEPARTMENT_OTHER)
Admission: EM | Admit: 2017-01-06 | Discharge: 2017-01-06 | Disposition: A | Discharge status: Home / Self Care | Payer: Self-pay | Attending: Physician Assistant | Admitting: Physician Assistant

## 2017-01-06 DIAGNOSIS — I10 Essential (primary) hypertension: Secondary | ICD-10-CM | POA: Insufficient documentation

## 2017-01-06 DIAGNOSIS — R0789 Other chest pain: Secondary | ICD-10-CM | POA: Insufficient documentation

## 2017-01-06 DIAGNOSIS — Z7982 Long term (current) use of aspirin: Secondary | ICD-10-CM | POA: Insufficient documentation

## 2017-01-06 DIAGNOSIS — Z87891 Personal history of nicotine dependence: Secondary | ICD-10-CM | POA: Insufficient documentation

## 2017-01-06 DIAGNOSIS — Z79899 Other long term (current) drug therapy: Secondary | ICD-10-CM | POA: Insufficient documentation

## 2017-01-06 LAB — COMPREHENSIVE METABOLIC PANEL
ALBUMIN: 4 g/dL (ref 3.5–5.0)
ALT: 33 U/L (ref 17–63)
ANION GAP: 6 (ref 5–15)
AST: 23 U/L (ref 15–41)
Alkaline Phosphatase: 58 U/L (ref 38–126)
BUN: 16 mg/dL (ref 6–20)
CALCIUM: 8.9 mg/dL (ref 8.9–10.3)
CO2: 28 mmol/L (ref 22–32)
Chloride: 106 mmol/L (ref 101–111)
Creatinine, Ser: 0.96 mg/dL (ref 0.61–1.24)
GFR calc non Af Amer: >60 mL/min (ref >60)
GLUCOSE: 89 mg/dL (ref 65–99)
Potassium: 3.6 mmol/L (ref 3.5–5.1)
SODIUM: 140 mmol/L (ref 135–145)
Total Bilirubin: 0.4 mg/dL (ref 0.3–1.2)
Total Protein: 6.9 g/dL (ref 6.5–8.1)

## 2017-01-06 LAB — CBC WITH DIFFERENTIAL/PLATELET
Basophils Absolute: 0 10*3/uL (ref 0.0–0.1)
Basophils Relative: 0 %
EOS ABS: 0.2 10*3/uL (ref 0.0–0.7)
EOS PCT: 3 %
HEMATOCRIT: 44 % (ref 39.0–52.0)
Hemoglobin: 15.3 g/dL (ref 13.0–17.0)
LYMPHS PCT: 17 %
Lymphs Abs: 1.4 10*3/uL (ref 0.7–4.0)
MCH: 29.9 pg (ref 26.0–34.0)
MCHC: 34.8 g/dL (ref 30.0–36.0)
MCV: 86.1 fL (ref 78.0–100.0)
MONOS PCT: 8 %
Monocytes Absolute: 0.7 10*3/uL (ref 0.1–1.0)
NEUTROS ABS: 5.9 10*3/uL (ref 1.7–7.7)
NEUTROS PCT: 72 %
Platelets: 79 10*3/uL — ABNORMAL LOW (ref 150–400)
RBC: 5.11 MIL/uL (ref 4.22–5.81)
RDW: 12.6 % (ref 11.5–15.5)
WBC: 8.2 10*3/uL (ref 4.0–10.5)

## 2017-01-06 LAB — LIPASE, BLOOD: LIPASE: 29 U/L (ref 11–51)

## 2017-01-06 LAB — D-DIMER, QUANTITATIVE (NOT AT ARMC)

## 2017-01-06 LAB — TROPONIN I

## 2017-01-06 MED ORDER — CYCLOBENZAPRINE HCL 10 MG PO TABS: 10.0000 mg | ORAL_TABLET | Freq: Two times a day (BID) | ORAL | 0 refills | Status: DC | PRN

## 2017-01-06 MED ORDER — IBUPROFEN 800 MG PO TABS: 800.0000 mg | ORAL_TABLET | Freq: Three times a day (TID) | ORAL | 0 refills | Status: DC

## 2017-01-06 MED ORDER — OXYCODONE-ACETAMINOPHEN 5-325 MG PO TABS
1.0000 | ORAL_TABLET | Freq: Once | ORAL | Status: AC
  Administered 2017-01-06: 1 via ORAL
  Filled 2017-01-06: qty 1

## 2017-01-06 MED ORDER — SODIUM CHLORIDE 0.9 % IV BOLUS (SEPSIS)
1000.0000 mL | Freq: Once | INTRAVENOUS | Status: AC
Start: 2017-01-06 — End: 2017-01-06
  Administered 2017-01-06: 1000 mL via INTRAVENOUS

## 2017-01-06 NOTE — ED Triage Notes (Signed)
Pt states he has been having right sided CP for about 1 month.  A/O, NAD ambulates to traige

## 2017-01-06 NOTE — ED Notes (Signed)
Pt c/o R sided chest pain that hurts with breathing, cough, and moving heavy objects. 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 Pt states the pain has been present x1 month, but suddenly became worse this past weekend while lying on the couch at home. Pt has tried taking 800mg  ibuprofen twice/day with no relief. Pt reports brief relief when using Biofreeze cream. Pt has last taken 800mg  of ibuprofen this AM at 06:15.

## 2017-01-06 NOTE — ED Provider Notes (Signed)
[facility] Provider Note   CSN: [id number] Arrival date & time: 01/06/17  1452     History   Chief Complaint Chief Complaint  Patient presents with  . Chest Pain    HPI Christopher [name] is a 46 y.o. male.   Chest Pain   Associated symptoms include cough. Pertinent negatives include no abdominal pain, no palpitations and no shortness of breath.   Patient is a 46 year old male presenting with right-sided chest pain. Patient reports "terrible". Patient has had it for 3 weeks. Patient had the flu prior to that. He reports is worse with taking deep breaths and moving his right arm. It is also worse with coughing. Patient left work today because the pain was too much. He reports that it is lingering all the time but worse with these actions. Patient denies any fevers, nausea, vomiting. He does have a residual cough.  Past Medical History:  Diagnosis Date  . Anxiety   . Hypertension     There are no active problems to display for this patient.   Past Surgical History:  Procedure Laterality Date  . NECK SURGERY    . WRIST SURGERY         Home Medications    Prior to Admission medications   Medication Sig Start Date End Date Taking? Authorizing Provider  albuterol (PROVENTIL HFA;VENTOLIN HFA) 108 (90 BASE) MCG/ACT inhaler Inhale 2 puffs into the lungs every 6 (six) hours as needed. For shortness of breath    Historical Provider, MD  ALPRAZolam Prudy Feeler) 1 MG tablet Take 1 mg by mouth 3 (three) times daily as needed. For anxiety    Historical Provider, MD  aspirin EC 81 MG tablet Take 81 mg by mouth daily.    Historical Provider, MD  Multiple Vitamin (MULTIVITAMIN WITH MINERALS) TABS Take 1 tablet by mouth daily.    Historical Provider, MD  triamterene-hydrochlorothiazide (MAXZIDE-25) 37.5-25 MG per tablet Take 1 tablet by mouth daily.    Historical Provider, MD    Family History History reviewed. No pertinent family history.  Social History Social History    Substance Use Topics  . Smoking status: Former Smoker    Types: Cigarettes  . Smokeless tobacco: Never Used  . Alcohol use Yes     Allergies   Patient has no known allergies.   Review of Systems Review of Systems  Constitutional: Negative for activity change and fatigue.  Respiratory: Positive for cough. Negative for shortness of breath.   Cardiovascular: Positive for chest pain. Negative for palpitations and leg swelling.  Gastrointestinal: Negative for abdominal pain.     Physical Exam Updated Vital Signs BP (!) 130/101   Pulse 88   Temp 98.2 F (36.8 C) (Oral)   Resp 18   Ht 5\' 10"  (1.778 m)   Wt 228 lb (103.4 kg)   SpO2 100%   BMI 32.71 kg/m   Physical Exam  Constitutional: He is oriented to person, place, and time. He appears well-nourished.  HENT:  Head: Normocephalic.  Eyes: Conjunctivae and EOM are normal.  Cardiovascular: Normal rate.   Pulmonary/Chest: Effort normal and breath sounds normal. No respiratory distress. He exhibits tenderness.  Mild tenderness to R chest wall  Abdominal: Soft.  Musculoskeletal: He exhibits no edema or deformity.  Neurological: He is oriented to person, place, and time.  Skin: Skin is warm and dry. He is not diaphoretic.  Psychiatric: He has a normal mood and affect. His behavior is normal.     ED Treatments /  Results  Labs (all labs ordered are listed, but only abnormal results are displayed) Labs Reviewed  COMPREHENSIVE METABOLIC PANEL  CBC WITH DIFFERENTIAL/PLATELET  LIPASE, BLOOD  D-DIMER, QUANTITATIVE (NOT AT [facility])  Rosezena SensorI-STAT TROPOININ, ED    EKG  EKG Interpretation  Date/Time:  Wednesday January 06 2017 15:02:34 EST Ventricular Rate:  87 PR Interval:    QRS Duration: 104 QT Interval:  362 QTC Calculation: 436 R Axis:   24 Text Interpretation:  Sinus rhythm RSR' in V1 or V2, right VCD or RVH Normal sinus rhythm No significant change since last tracing Confirmed by Kandis MannanMACKUEN, COURTNEY ([id number]) on 01/06/2017  3:07:16 PM       Radiology No results found.  Procedures Procedures (including critical care time)  Medications Ordered in ED Medications  sodium chloride 0.9 % bolus 1,000 mL (not administered)  oxyCODONE-acetaminophen (PERCOCET/ROXICET) 5-325 MG per tablet 1 tablet (not administered)     Initial Impression / Assessment and Plan / ED Course  I have reviewed the triage vital signs and the nursing notes.  Pertinent labs & imaging results that were available during my care of the patient were reviewed by me and considered in my medical decision making (see chart for details).    Patient is well-appearing 46 year old male presenting with chest pain in the right chest wall for the last 3 weeks. Patient reports is worse with coughing taking deep breaths. I suspect costochondritis versus pleuritic chest pain. However we will get a d-dimer labs, EKG to make sure there is no dangerous pathology.  5:08 PM Patient has negative d-dimer. Chest x-ray is normal. EKG is nonischemic. Defect was going on for 3 works as were worse with movement and deep breaths I think this likely muscular skeletal in nature. We'll have him follow-up with a primary care physician and return with any symptoms that are concerning.  Doubt this is ACS given the long period of time this been going on that it is nonexertional. Doubt any kind of dissection or other pathology given the symptoms are related to deep breaths.  Final Clinical Impressions(s) / ED Diagnoses   Final diagnoses:  None    New Prescriptions New Prescriptions   No medications on file     Margrette Wynia Randall AnLyn Marcelia Petersen, MD 01/06/17 1709

## 2017-01-06 NOTE — Discharge Instructions (Signed)
Please return with any change in symtpoms or concerns.  To find a primary care or specialty doctor please call [phone] or [phone] to access "[facility] Find a Doctor Service."  You may also go on the [facility] website at [email or web address]  There are also multiple [facility] and [facility] practices throughout the [place] that are frequently accepting new patients. You may find a clinic that is close to your home and contact them.  [facility] -  [address] [place] [phone]  [facility] in [place] (also locations in [place] and [place]) -  [address] [place] [id number] [phone]  [facility] -  [address] [place] [id number] [phone]

## 2017-11-02 DIAGNOSIS — Z87828 Personal history of other (healed) physical injury and trauma: Secondary | ICD-10-CM

## 2017-11-02 HISTORY — DX: Personal history of other (healed) physical injury and trauma: Z87.828

## 2018-09-12 ENCOUNTER — Encounter: Payer: Self-pay | Admitting: *Deleted

## 2018-09-13 ENCOUNTER — Encounter: Payer: Self-pay | Admitting: Diagnostic Neuroimaging

## 2018-09-13 ENCOUNTER — Ambulatory Visit (INDEPENDENT_AMBULATORY_CARE_PROVIDER_SITE_OTHER): Payer: Commercial Managed Care - PPO | Admitting: Diagnostic Neuroimaging

## 2018-09-13 VITALS — BP 152/99 | HR 81 | Ht 70.0 in | Wt 247.0 lb

## 2018-09-13 DIAGNOSIS — R200 Anesthesia of skin: Secondary | ICD-10-CM

## 2018-09-13 DIAGNOSIS — R4781 Slurred speech: Secondary | ICD-10-CM | POA: Diagnosis not present

## 2018-09-13 DIAGNOSIS — G459 Transient cerebral ischemic attack, unspecified: Secondary | ICD-10-CM

## 2018-09-13 DIAGNOSIS — D696 Thrombocytopenia, unspecified: Secondary | ICD-10-CM | POA: Diagnosis not present
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
## 2018-09-13 NOTE — Patient Instructions (Signed)
TRANSIENT LEFT ARM NUMBNESS + SLURRED SPEECH - TIA (warning to stroke) workup --> MRI brain, ultrasound of carotid (neck) and heart, cholesterol / lipid panel, diabetes testing, monitor and treat high blood pressure  - may consider aspirin in future but will hold off for now due to low platelets and GI bleeding  - cervical radiculopathy (pinched nerve in neck) workup --> MRI cervical spine (h/o c-spine C3-C4 fusion in 2012)   - carpal tunnel syndrome / neuropathy workup --> EMG/NCS (electrical nerve testing)

## 2018-09-14 ENCOUNTER — Telehealth: Payer: Self-pay | Admitting: Diagnostic Neuroimaging

## 2018-09-14 LAB — COMPREHENSIVE METABOLIC PANEL
ALBUMIN: 4.5 g/dL (ref 3.5–5.5)
ALK PHOS: 72 IU/L (ref 39–117)
ALT: 171 IU/L — ABNORMAL HIGH (ref 0–44)
AST: 113 IU/L — AB (ref 0–40)
Albumin/Globulin Ratio: 1.7 (ref 1.2–2.2)
BILIRUBIN TOTAL: 0.6 mg/dL (ref 0.0–1.2)
BUN / CREAT RATIO: 15 (ref 9–20)
BUN: 15 mg/dL (ref 6–24)
CALCIUM: 9.2 mg/dL (ref 8.7–10.2)
CHLORIDE: 106 mmol/L (ref 96–106)
CO2: 22 mmol/L (ref 20–29)
Creatinine, Ser: 0.98 mg/dL (ref 0.76–1.27)
GFR calc Af Amer: 106 mL/min/{1.73_m2} (ref >59)
GFR calc non Af Amer: 92 mL/min/{1.73_m2} (ref >59)
GLOBULIN, TOTAL: 2.7 g/dL (ref 1.5–4.5)
GLUCOSE: 111 mg/dL — AB (ref 65–99)
Potassium: 4.4 mmol/L (ref 3.5–5.2)
SODIUM: 143 mmol/L (ref 134–144)
Total Protein: 7.2 g/dL (ref 6.0–8.5)

## 2018-09-14 LAB — CBC WITH DIFFERENTIAL/PLATELET
BASOS: 1 %
Basophils Absolute: 0.1 10*3/uL (ref 0.0–0.2)
EOS (ABSOLUTE): 0.2 10*3/uL (ref 0.0–0.4)
EOS: 4 %
HEMATOCRIT: 47.4 % (ref 37.5–51.0)
HEMOGLOBIN: 16.1 g/dL (ref 13.0–17.7)
IMMATURE GRANS (ABS): 0 10*3/uL (ref 0.0–0.1)
IMMATURE GRANULOCYTES: 0 %
LYMPHS: 27 %
Lymphocytes Absolute: 1.5 10*3/uL (ref 0.7–3.1)
MCH: 30.2 pg (ref 26.6–33.0)
MCHC: 34 g/dL (ref 31.5–35.7)
MCV: 89 fL (ref 79–97)
Monocytes Absolute: 0.6 10*3/uL (ref 0.1–0.9)
Monocytes: 10 %
Neutrophils Absolute: 3.3 10*3/uL (ref 1.4–7.0)
Neutrophils: 58 %
Platelets: 86 10*3/uL — CL (ref 150–450)
RBC: 5.33 x10E6/uL (ref 4.14–5.80)
RDW: 13.1 % (ref 12.3–15.4)
WBC: 5.7 10*3/uL (ref 3.4–10.8)

## 2018-09-14 LAB — LIPID PANEL
CHOLESTEROL TOTAL: 205 mg/dL — AB (ref 100–199)
Chol/HDL Ratio: 3.9 ratio (ref 0.0–5.0)
HDL: 53 mg/dL (ref >39)
LDL Calculated: 134 mg/dL — ABNORMAL HIGH (ref 0–99)
TRIGLYCERIDES: 91 mg/dL (ref 0–149)
VLDL Cholesterol Cal: 18 mg/dL (ref 5–40)

## 2018-09-14 LAB — HEMOGLOBIN A1C
Est. average glucose Bld gHb Est-mCnc: 120 mg/dL
Hgb A1c MFr Bld: 5.8 % — ABNORMAL HIGH (ref 4.8–5.6)

## 2018-09-14 NOTE — Telephone Encounter (Signed)
Just an FYI I spoke to the patient to schedule his MRI's due to his deductible not met his part would be $2,036.25 I did offer payment plan but he stated he is going to wait on having them done.    [facility] Auth: [id number] (exp. 09/13/18 to 10/12/18)

## 2018-09-14 NOTE — Telephone Encounter (Signed)
Noted  

## 2018-09-26 ENCOUNTER — Telehealth: Payer: Self-pay | Admitting: *Deleted

## 2018-09-26 NOTE — Telephone Encounter (Signed)
Error , see other phone note dated today.

## 2018-09-26 NOTE — Telephone Encounter (Signed)
Received call back from patient. This RN advised him of his lab resuklts and asked who his PCP is. He stated he still sees D Collins, DO. He gave Ulyses Amorhti srN the [facility] number, [facility]. He asked what "is going on with my liver". This RN advised he call his PCP to discuss; the question is out of the scope of practice for this RN . He stated he will call PCP this afternoon to FU on labs. He verbalized understanding, appreciation. [facility] attn Irena Reichmannana Collins DO.

## 2018-09-26 NOTE — Telephone Encounter (Addendum)
Called patient and LVM informing him his labs showed elevated sugar, elevated cholesterol, LDL and LFTs; and low platelets. Advised him [facility]. Advised patient to follow up with  His PCP re: lab results. Left number for any questions. Attempted to get fax number for patients listed  PCP, Abran Dukeana Collin Do. When this RN called number from Internet, the staff stated she left the office over a year ago and is a Interior and spatial designerdirector over a nursing home.  Attempted to call patient on home and mobile # to get current PCP name; got immediate busy dial tones on both numbers. Will try later.

## 2018-10-12 ENCOUNTER — Ambulatory Visit (HOSPITAL_BASED_OUTPATIENT_CLINIC_OR_DEPARTMENT_OTHER): Payer: Commercial Managed Care - PPO

## 2018-10-20 ENCOUNTER — Encounter: Payer: Commercial Managed Care - PPO | Admitting: Diagnostic Neuroimaging

## 2018-10-31 ENCOUNTER — Other Ambulatory Visit: Payer: Self-pay | Admitting: Family Medicine

## 2018-10-31 DIAGNOSIS — M25562 Pain in left knee: Secondary | ICD-10-CM

## 2018-11-03 ENCOUNTER — Ambulatory Visit
Admission: RE | Admit: 2018-11-03 | Discharge: 2018-11-03 | Disposition: A | Discharge status: Home / Self Care | Payer: Commercial Managed Care - PPO | Source: Ambulatory Visit | Attending: Family Medicine | Admitting: Family Medicine

## 2018-11-03 DIAGNOSIS — M25562 Pain in left knee: Secondary | ICD-10-CM

## 2018-11-07 ENCOUNTER — Other Ambulatory Visit: Payer: Self-pay

## 2018-11-07 ENCOUNTER — Telehealth: Payer: Self-pay | Admitting: Hematology

## 2018-11-07 NOTE — Telephone Encounter (Signed)
lmom for pt to return call to office re new patient appt. Mailed appt letter  For 11/21/2018 at 0830 as well

## 2018-11-17 ENCOUNTER — Other Ambulatory Visit: Payer: Self-pay | Admitting: Hematology

## 2018-11-17 DIAGNOSIS — D696 Thrombocytopenia, unspecified: Secondary | ICD-10-CM
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
## 2018-11-21 ENCOUNTER — Inpatient Hospital Stay: Payer: Commercial Managed Care - PPO

## 2018-11-21 ENCOUNTER — Inpatient Hospital Stay: Payer: Commercial Managed Care - PPO | Attending: Hematology | Admitting: Hematology

## 2018-11-21 ENCOUNTER — Encounter: Payer: Self-pay | Admitting: Hematology

## 2018-11-21 VITALS — BP 140/86 | HR 82 | Temp 98.8°F | Resp 19 | Ht 70.0 in | Wt 245.8 lb

## 2018-11-21 DIAGNOSIS — Z87891 Personal history of nicotine dependence: Secondary | ICD-10-CM | POA: Diagnosis not present

## 2018-11-21 DIAGNOSIS — F1010 Alcohol abuse, uncomplicated: Secondary | ICD-10-CM | POA: Diagnosis not present

## 2018-11-21 DIAGNOSIS — Z7982 Long term (current) use of aspirin: Secondary | ICD-10-CM

## 2018-11-21 DIAGNOSIS — M25462 Effusion, left knee: Secondary | ICD-10-CM | POA: Insufficient documentation

## 2018-11-21 DIAGNOSIS — R748 Abnormal levels of other serum enzymes: Secondary | ICD-10-CM | POA: Diagnosis not present

## 2018-11-21 DIAGNOSIS — D696 Thrombocytopenia, unspecified: Secondary | ICD-10-CM | POA: Diagnosis present

## 2018-11-21 DIAGNOSIS — Z79899 Other long term (current) drug therapy: Secondary | ICD-10-CM | POA: Insufficient documentation

## 2018-11-21 DIAGNOSIS — I10 Essential (primary) hypertension: Secondary | ICD-10-CM | POA: Diagnosis not present

## 2018-11-21 DIAGNOSIS — R945 Abnormal results of liver function studies: Secondary | ICD-10-CM

## 2018-11-21 DIAGNOSIS — R7989 Other specified abnormal findings of blood chemistry: Secondary | ICD-10-CM | POA: Insufficient documentation

## 2018-11-21 LAB — FOLATE: FOLATE: 19.7 ng/mL (ref >5.9)

## 2018-11-21 LAB — CMP (CANCER CENTER ONLY)
ALT: 76 U/L — ABNORMAL HIGH (ref 0–44)
ANION GAP: 8 (ref 5–15)
AST: 43 U/L — ABNORMAL HIGH (ref 15–41)
Albumin: 4.2 g/dL (ref 3.5–5.0)
Alkaline Phosphatase: 63 U/L (ref 38–126)
BUN: 17 mg/dL (ref 6–20)
CO2: 28 mmol/L (ref 22–32)
Calcium: 9.2 mg/dL (ref 8.9–10.3)
Chloride: 106 mmol/L (ref 98–111)
Creatinine: 0.9 mg/dL (ref 0.61–1.24)
GFR, Est AFR Am: >60 mL/min (ref >60)
GFR, Estimated: >60 mL/min (ref >60)
GLUCOSE: 138 mg/dL — AB (ref 70–99)
Potassium: 3.6 mmol/L (ref 3.5–5.1)
Sodium: 142 mmol/L (ref 135–145)
TOTAL PROTEIN: 6.7 g/dL (ref 6.5–8.1)
Total Bilirubin: 0.4 mg/dL (ref 0.3–1.2)

## 2018-11-21 LAB — CBC WITH DIFFERENTIAL (CANCER CENTER ONLY)
Abs Immature Granulocytes: 0.01 10*3/uL (ref 0.00–0.07)
BASOS ABS: 0 10*3/uL (ref 0.0–0.1)
BASOS PCT: 1 %
Eosinophils Absolute: 0.3 10*3/uL (ref 0.0–0.5)
Eosinophils Relative: 5 %
HEMATOCRIT: 44.2 % (ref 39.0–52.0)
Hemoglobin: 14.8 g/dL (ref 13.0–17.0)
Immature Granulocytes: 0 %
Lymphocytes Relative: 23 %
Lymphs Abs: 1.4 10*3/uL (ref 0.7–4.0)
MCH: 30.5 pg (ref 26.0–34.0)
MCHC: 33.5 g/dL (ref 30.0–36.0)
MCV: 91.1 fL (ref 80.0–100.0)
MONOS PCT: 10 %
Monocytes Absolute: 0.6 10*3/uL (ref 0.1–1.0)
Neutro Abs: 3.7 10*3/uL (ref 1.7–7.7)
Neutrophils Relative %: 61 %
Platelet Count: 90 10*3/uL — ABNORMAL LOW (ref 150–400)
RBC: 4.85 MIL/uL (ref 4.22–5.81)
RDW: 11.9 % (ref 11.5–15.5)
WBC: 6.2 10*3/uL (ref 4.0–10.5)
nRBC: 0 % (ref 0.0–0.2)

## 2018-11-21 LAB — VITAMIN B12: VITAMIN B 12: 540 pg/mL (ref 180–914)

## 2018-11-21 LAB — PROTIME-INR
INR: 1.04
Prothrombin Time: 13.5 seconds (ref 11.4–15.2)

## 2018-11-21 LAB — APTT: aPTT: 27 seconds (ref 24–36)

## 2018-11-21 LAB — SAVE SMEAR (SSMR)

## 2018-11-21 LAB — LACTATE DEHYDROGENASE: LDH: 193 U/L — AB (ref 98–192)

## 2018-11-22 LAB — HEPATITIS B SURFACE ANTIGEN: Hepatitis B Surface Ag: NEGATIVE

## 2018-11-22 LAB — HEPATITIS B SURFACE ANTIBODY,QUALITATIVE: HEP B S AB: NONREACTIVE

## 2018-11-22 LAB — HIV ANTIBODY (ROUTINE TESTING W REFLEX): HIV SCREEN 4TH GENERATION: NONREACTIVE

## 2018-11-22 LAB — HCV COMMENT:

## 2018-11-22 LAB — HEPATITIS C ANTIBODY (REFLEX): HCV Ab: 0.1 s/co ratio (ref 0.0–0.9)

## 2018-11-24 ENCOUNTER — Ambulatory Visit (HOSPITAL_BASED_OUTPATIENT_CLINIC_OR_DEPARTMENT_OTHER): Payer: Commercial Managed Care - PPO

## 2018-12-02 NOTE — Progress Notes (Signed)
Need orders for 12-07-18 surgery in epic

## 2018-12-05 ENCOUNTER — Other Ambulatory Visit: Payer: Self-pay

## 2018-12-05 ENCOUNTER — Encounter (HOSPITAL_BASED_OUTPATIENT_CLINIC_OR_DEPARTMENT_OTHER): Payer: Self-pay | Admitting: *Deleted

## 2018-12-05 NOTE — Progress Notes (Signed)
Spoke w/ pt via phone for pre-op interview.  Npo after mn.  Arrive at 0930.  Needs cbc , cmt, and ekg.  Pt to take norvasc and prilosec am dos w/ sips of water and asked to bring rescue inhaler.  Pt had stated that he quit all his medication last week including his norvasc, when I advised him that should be taking that every day he stated well he'll be at the hospital so if it's blood pressure high it want matter.  Pt was given advise to restart his norvasc and daily including morning of surgery because if his blood pressure is to high anesthesia could cancel his procedure since it is elective.  Pt verbalized understanding.  Pt last used cocaine 2018 ask [facility] if drug screen needed.  Pt also advised to alcohol 24 hours prior to surgery or at least can is down.

## 2018-12-06 ENCOUNTER — Ambulatory Visit: Payer: Self-pay | Admitting: Orthopedic Surgery
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
## 2018-12-07 ENCOUNTER — Encounter (HOSPITAL_BASED_OUTPATIENT_CLINIC_OR_DEPARTMENT_OTHER): Payer: Self-pay | Admitting: Anesthesiology

## 2018-12-07 ENCOUNTER — Ambulatory Visit (HOSPITAL_BASED_OUTPATIENT_CLINIC_OR_DEPARTMENT_OTHER)
Admission: RE | Admit: 2018-12-07 | Discharge: 2018-12-07 | Disposition: A | Discharge status: Home / Self Care | Payer: Commercial Managed Care - PPO | Attending: Specialist | Admitting: Specialist

## 2018-12-07 ENCOUNTER — Ambulatory Visit (HOSPITAL_BASED_OUTPATIENT_CLINIC_OR_DEPARTMENT_OTHER): Payer: Commercial Managed Care - PPO | Admitting: Anesthesiology

## 2018-12-07 ENCOUNTER — Encounter (HOSPITAL_BASED_OUTPATIENT_CLINIC_OR_DEPARTMENT_OTHER)
Admission: RE | Disposition: A | Discharge status: Home / Self Care | Payer: Self-pay | Source: Home / Self Care | Attending: Specialist

## 2018-12-07 ENCOUNTER — Other Ambulatory Visit: Payer: Self-pay

## 2018-12-07 DIAGNOSIS — Z87891 Personal history of nicotine dependence: Secondary | ICD-10-CM | POA: Diagnosis not present

## 2018-12-07 DIAGNOSIS — F4310 Post-traumatic stress disorder, unspecified: Secondary | ICD-10-CM | POA: Insufficient documentation

## 2018-12-07 DIAGNOSIS — K219 Gastro-esophageal reflux disease without esophagitis: Secondary | ICD-10-CM | POA: Insufficient documentation

## 2018-12-07 DIAGNOSIS — I10 Essential (primary) hypertension: Secondary | ICD-10-CM | POA: Insufficient documentation

## 2018-12-07 DIAGNOSIS — S83242A Other tear of medial meniscus, current injury, left knee, initial encounter: Secondary | ICD-10-CM | POA: Diagnosis not present

## 2018-12-07 DIAGNOSIS — Z79899 Other long term (current) drug therapy: Secondary | ICD-10-CM | POA: Diagnosis not present

## 2018-12-07 DIAGNOSIS — M23322 Other meniscus derangements, posterior horn of medial meniscus, left knee: Secondary | ICD-10-CM

## 2018-12-07 DIAGNOSIS — X501XXA Overexertion from prolonged static or awkward postures, initial encounter: Secondary | ICD-10-CM | POA: Diagnosis not present

## 2018-12-07 DIAGNOSIS — Z8673 Personal history of transient ischemic attack (TIA), and cerebral infarction without residual deficits: Secondary | ICD-10-CM | POA: Insufficient documentation

## 2018-12-07 DIAGNOSIS — Z8614 Personal history of Methicillin resistant Staphylococcus aureus infection: Secondary | ICD-10-CM | POA: Insufficient documentation

## 2018-12-07 HISTORY — DX: Gastro-esophageal reflux disease without esophagitis: K21.9

## 2018-12-07 HISTORY — DX: Unspecified tear of unspecified meniscus, current injury, left knee, initial encounter: S83.207A

## 2018-12-07 HISTORY — DX: Thrombocytopenia, unspecified: D69.6

## 2018-12-07 HISTORY — DX: Personal history of other (healed) physical injury and trauma: Z87.828

## 2018-12-07 HISTORY — DX: Abnormal levels of other serum enzymes: R74.8

## 2018-12-07 HISTORY — DX: Post-traumatic stress disorder, unspecified: F43.10

## 2018-12-07 HISTORY — DX: Personal history of transient ischemic attack (TIA), and cerebral infarction without residual deficits: Z86.73

## 2018-12-07 HISTORY — DX: Alcohol abuse, uncomplicated: F10.10

## 2018-12-07 HISTORY — PX: KNEE ARTHROSCOPY WITH MEDIAL MENISECTOMY: SHX5651

## 2018-12-07 LAB — CBC
HCT: 45.8 % (ref 39.0–52.0)
Hemoglobin: 15.1 g/dL (ref 13.0–17.0)
MCH: 30.1 pg (ref 26.0–34.0)
MCHC: 33 g/dL (ref 30.0–36.0)
MCV: 91.4 fL (ref 80.0–100.0)
PLATELETS: 89 10*3/uL — AB (ref 150–400)
RBC: 5.01 MIL/uL (ref 4.22–5.81)
RDW: 12.4 % (ref 11.5–15.5)
WBC: 5.2 10*3/uL (ref 4.0–10.5)
nRBC: 0 % (ref 0.0–0.2)

## 2018-12-07 LAB — COMPREHENSIVE METABOLIC PANEL
ALBUMIN: 4.4 g/dL (ref 3.5–5.0)
ALT: 94 U/L — ABNORMAL HIGH (ref 0–44)
AST: 54 U/L — ABNORMAL HIGH (ref 15–41)
Alkaline Phosphatase: 55 U/L (ref 38–126)
Anion gap: 7 (ref 5–15)
BUN: 15 mg/dL (ref 6–20)
CHLORIDE: 105 mmol/L (ref 98–111)
CO2: 25 mmol/L (ref 22–32)
Calcium: 8.8 mg/dL — ABNORMAL LOW (ref 8.9–10.3)
Creatinine, Ser: 0.85 mg/dL (ref 0.61–1.24)
GFR calc Af Amer: >60 mL/min (ref >60)
GFR calc non Af Amer: >60 mL/min (ref >60)
GLUCOSE: 123 mg/dL — AB (ref 70–99)
Potassium: 3.7 mmol/L (ref 3.5–5.1)
SODIUM: 137 mmol/L (ref 135–145)
Total Bilirubin: 0.6 mg/dL (ref 0.3–1.2)
Total Protein: 7.4 g/dL (ref 6.5–8.1)

## 2018-12-07 SURGERY — ARTHROSCOPY, KNEE, WITH MEDIAL MENISCECTOMY
Anesthesia: General | Site: Knee | Laterality: Left

## 2018-12-07 MED ORDER — ACETAMINOPHEN 500 MG PO TABS
ORAL_TABLET | ORAL | Status: AC
  Filled 2018-12-07: qty 1

## 2018-12-07 MED ORDER — DEXAMETHASONE SODIUM PHOSPHATE 10 MG/ML IJ SOLN
INTRAMUSCULAR | Status: AC
  Filled 2018-12-07: qty 1

## 2018-12-07 MED ORDER — VANCOMYCIN HCL IN DEXTROSE 1-5 GM/200ML-% IV SOLN
INTRAVENOUS | Status: AC
  Filled 2018-12-07: qty 200

## 2018-12-07 MED ORDER — MIDAZOLAM HCL 2 MG/2ML IJ SOLN
INTRAMUSCULAR | Status: AC
  Filled 2018-12-07: qty 2

## 2018-12-07 MED ORDER — LIDOCAINE 2% (20 MG/ML) 5 ML SYRINGE
INTRAMUSCULAR | Status: DC | PRN
  Administered 2018-12-07: 100 mg via INTRAVENOUS

## 2018-12-07 MED ORDER — KETOROLAC TROMETHAMINE 30 MG/ML IJ SOLN
INTRAMUSCULAR | Status: AC
  Filled 2018-12-07: qty 1

## 2018-12-07 MED ORDER — PROMETHAZINE HCL 25 MG/ML IJ SOLN: 6.2500 mg | INTRAMUSCULAR | Status: DC | PRN

## 2018-12-07 MED ORDER — HYDROMORPHONE HCL 1 MG/ML IJ SOLN
INTRAMUSCULAR | Status: AC
  Filled 2018-12-07: qty 1

## 2018-12-07 MED ORDER — OXYCODONE HCL 5 MG/5ML PO SOLN
5.0000 mg | Freq: Once | ORAL | Status: AC | PRN
  Filled 2018-12-07: qty 5

## 2018-12-07 MED ORDER — LACTATED RINGERS IV SOLN
INTRAVENOUS | Status: DC
  Filled 2018-12-07: qty 1000

## 2018-12-07 MED ORDER — FENTANYL CITRATE (PF) 100 MCG/2ML IJ SOLN
INTRAMUSCULAR | Status: AC
  Filled 2018-12-07: qty 2

## 2018-12-07 MED ORDER — ONDANSETRON HCL 4 MG/2ML IJ SOLN
INTRAMUSCULAR | Status: DC | PRN
  Administered 2018-12-07: 4 mg via INTRAVENOUS

## 2018-12-07 MED ORDER — DEXAMETHASONE SODIUM PHOSPHATE 4 MG/ML IJ SOLN
INTRAMUSCULAR | Status: DC | PRN
  Administered 2018-12-07: 10 mg via INTRAVENOUS

## 2018-12-07 MED ORDER — MIDAZOLAM HCL 5 MG/5ML IJ SOLN
INTRAMUSCULAR | Status: DC | PRN
  Administered 2018-12-07: 2 mg via INTRAVENOUS

## 2018-12-07 MED ORDER — FENTANYL CITRATE (PF) 100 MCG/2ML IJ SOLN
25.0000 ug | INTRAMUSCULAR | Status: DC | PRN
  Administered 2018-12-07 (×2): 50 ug via INTRAVENOUS
  Filled 2018-12-07: qty 1

## 2018-12-07 MED ORDER — CHLORHEXIDINE GLUCONATE 4 % EX LIQD
60.0000 mL | Freq: Once | CUTANEOUS | Status: DC
  Filled 2018-12-07: qty 118

## 2018-12-07 MED ORDER — BUPIVACAINE-EPINEPHRINE 0.5% -1:200000 IJ SOLN
INTRAMUSCULAR | Status: DC | PRN
  Administered 2018-12-07: 20 mL

## 2018-12-07 MED ORDER — PROPOFOL 10 MG/ML IV BOLUS
INTRAVENOUS | Status: AC
  Filled 2018-12-07: qty 40

## 2018-12-07 MED ORDER — KETOROLAC TROMETHAMINE 30 MG/ML IJ SOLN
INTRAMUSCULAR | Status: DC | PRN
  Administered 2018-12-07: 30 mg via INTRAVENOUS

## 2018-12-07 MED ORDER — LACTATED RINGERS IV SOLN
INTRAVENOUS | Status: DC
  Administered 2018-12-07 (×2): via INTRAVENOUS
  Filled 2018-12-07: qty 1000

## 2018-12-07 MED ORDER — ACETAMINOPHEN 500 MG PO TABS
1000.0000 mg | ORAL_TABLET | Freq: Once | ORAL | Status: AC
  Administered 2018-12-07: 1000 mg via ORAL
  Filled 2018-12-07: qty 2

## 2018-12-07 MED ORDER — PROPOFOL 10 MG/ML IV BOLUS
INTRAVENOUS | Status: DC | PRN
  Administered 2018-12-07: 150 mg via INTRAVENOUS
  Administered 2018-12-07: 50 mg via INTRAVENOUS

## 2018-12-07 MED ORDER — HYDROMORPHONE HCL 1 MG/ML IJ SOLN
0.5000 mg | INTRAMUSCULAR | Status: DC | PRN
  Administered 2018-12-07 (×2): 0.5 mg via INTRAVENOUS
  Filled 2018-12-07: qty 0.5

## 2018-12-07 MED ORDER — VANCOMYCIN HCL IN DEXTROSE 1-5 GM/200ML-% IV SOLN
1000.0000 mg | INTRAVENOUS | Status: AC
  Administered 2018-12-07: 1000 mg via INTRAVENOUS
  Filled 2018-12-07: qty 200

## 2018-12-07 MED ORDER — CEFAZOLIN SODIUM-DEXTROSE 2-4 GM/100ML-% IV SOLN
INTRAVENOUS | Status: AC
  Filled 2018-12-07: qty 100

## 2018-12-07 MED ORDER — CEFAZOLIN SODIUM-DEXTROSE 2-4 GM/100ML-% IV SOLN
2.0000 g | INTRAVENOUS | Status: AC
  Administered 2018-12-07: 2 g via INTRAVENOUS
  Filled 2018-12-07: qty 100

## 2018-12-07 MED ORDER — ONDANSETRON HCL 4 MG/2ML IJ SOLN
INTRAMUSCULAR | Status: AC
  Filled 2018-12-07: qty 2

## 2018-12-07 MED ORDER — OXYCODONE HCL 5 MG PO TABS
ORAL_TABLET | ORAL | Status: AC
  Filled 2018-12-07: qty 1

## 2018-12-07 MED ORDER — SODIUM CHLORIDE 0.9 % IR SOLN
Status: DC | PRN
  Administered 2018-12-07 (×3): 3000 mL

## 2018-12-07 MED ORDER — LIDOCAINE 2% (20 MG/ML) 5 ML SYRINGE
INTRAMUSCULAR | Status: AC
  Filled 2018-12-07: qty 5

## 2018-12-07 MED ORDER — OXYCODONE HCL 5 MG PO TABS
5.0000 mg | ORAL_TABLET | Freq: Once | ORAL | Status: AC | PRN
  Administered 2018-12-07: 5 mg via ORAL
  Filled 2018-12-07: qty 1

## 2018-12-07 MED ORDER — ARTIFICIAL TEARS OPHTHALMIC OINT
TOPICAL_OINTMENT | OPHTHALMIC | Status: AC
  Filled 2018-12-07: qty 3.5

## 2018-12-07 MED ORDER — FENTANYL CITRATE (PF) 100 MCG/2ML IJ SOLN
INTRAMUSCULAR | Status: DC | PRN
  Administered 2018-12-07 (×4): 50 ug via INTRAVENOUS

## 2018-12-07 SURGICAL SUPPLY — 49 items
4.0mm Torpedo ×2 IMPLANT
BANDAGE ACE 6X5 VEL STRL LF (GAUZE/BANDAGES/DRESSINGS) ×3 IMPLANT
BANDAGE ELASTIC 6 VELCRO ST LF (GAUZE/BANDAGES/DRESSINGS) ×3 IMPLANT
BLADE 11 SAFETY STRL DISP (BLADE) IMPLANT
BLADE CUDA SHAVER 3.5 (BLADE) IMPLANT
BNDG COHESIVE 6X5 TAN NS LF (GAUZE/BANDAGES/DRESSINGS) ×3 IMPLANT
BOOTIES KNEE HIGH SLOAN (MISCELLANEOUS) ×6 IMPLANT
COVER WAND RF STERILE (DRAPES) ×3 IMPLANT
DRAPE ARTHROSCOPY W/POUCH 114 (DRAPES) ×3 IMPLANT
DRAPE SHEET LG 3/4 BI-LAMINATE (DRAPES) IMPLANT
DRSG PAD ABDOMINAL 8X10 ST (GAUZE/BANDAGES/DRESSINGS) ×3 IMPLANT
DURAPREP 26ML APPLICATOR (WOUND CARE) ×3 IMPLANT
ELECT MENISCUS 165MM 90D (ELECTRODE) IMPLANT
ELECT REM PT RETURN 9FT ADLT (ELECTROSURGICAL)
ELECTRODE REM PT RTRN 9FT ADLT (ELECTROSURGICAL) IMPLANT
GAUZE SPONGE 4X4 12PLY STRL (GAUZE/BANDAGES/DRESSINGS) ×2 IMPLANT
GLOVE BIOGEL PI IND STRL 7.0 (GLOVE) ×1 IMPLANT
GLOVE BIOGEL PI INDICATOR 7.0 (GLOVE) ×2
GLOVE SURG SS PI 7.0 STRL IVOR (GLOVE) ×3 IMPLANT
GLOVE SURG SS PI 8.0 STRL IVOR (GLOVE) ×3 IMPLANT
GOWN STRL REUS W/ TWL XL LVL3 (GOWN DISPOSABLE) ×2 IMPLANT
GOWN STRL REUS W/TWL XL LVL3 (GOWN DISPOSABLE) ×6
IV NS IRRIG 3000ML ARTHROMATIC (IV SOLUTION) ×6 IMPLANT
KIT TURNOVER CYSTO (KITS) ×3 IMPLANT
KNEE WRAP E Z 3 GEL PACK (MISCELLANEOUS) ×3 IMPLANT
MANIFOLD NEPTUNE II (INSTRUMENTS) ×3 IMPLANT
NDL FILTER BLUNT 18X1 1/2 (NEEDLE) ×1 IMPLANT
NDL SAFETY ECLIPSE 18X1.5 (NEEDLE) ×1 IMPLANT
NEEDLE FILTER BLUNT 18X 1/2SAF (NEEDLE) ×2
NEEDLE FILTER BLUNT 18X1 1/2 (NEEDLE) ×1 IMPLANT
NEEDLE HYPO 18GX1.5 SHARP (NEEDLE) ×3
PACK ARTHROSCOPY DSU (CUSTOM PROCEDURE TRAY) ×3 IMPLANT
PACK BASIN DAY SURGERY FS (CUSTOM PROCEDURE TRAY) ×3 IMPLANT
PACK ICE MAXI GEL EZY WRAP (MISCELLANEOUS) ×2 IMPLANT
PAD ABD 8X10 STRL (GAUZE/BANDAGES/DRESSINGS) ×2 IMPLANT
PADDING CAST COTTON 6X4 STRL (CAST SUPPLIES) ×3 IMPLANT
PROBE BIPOLAR 50 DEGREE SUCT (MISCELLANEOUS) ×1 IMPLANT
RESECTOR FULL RADIUS 4.2MM (BLADE) IMPLANT
RESECTOR TORPEDO 4MM 13CM CVD (MISCELLANEOUS) ×2 IMPLANT
SET ARTHROSCOPY TUBING (MISCELLANEOUS)
SET ARTHROSCOPY TUBING LN (MISCELLANEOUS) ×1 IMPLANT
SHAVER 4.2 MM LANZA 9391A (BLADE) ×1 IMPLANT
SUT ETHILON 4 0 PS 2 18 (SUTURE) ×3 IMPLANT
SYR 10ML LL (SYRINGE) IMPLANT
SYR 30ML LL (SYRINGE) ×3 IMPLANT
TOWEL OR 17X24 6PK STRL BLUE (TOWEL DISPOSABLE) ×3 IMPLANT
TUBE CONNECTING 12'X1/4 (SUCTIONS) ×1
TUBE CONNECTING 12X1/4 (SUCTIONS) ×1 IMPLANT
WATER STERILE IRR 500ML POUR (IV SOLUTION) ×3 IMPLANT
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
## 2018-12-07 NOTE — Anesthesia Procedure Notes (Signed)
Performed by: Rose, George, MD       

## 2018-12-07 NOTE — Interval H&P Note (Signed)
History and Physical Interval Note:  12/07/2018 11:29 AM  Christopher [name]  has presented today for surgery, with the diagnosis of Left knee medial meniscal tear  The various methods of treatment have been discussed with the patient and family. After consideration of risks, benefits and other options for treatment, the patient has consented to  Procedure(s): Left knee arthroscopy, partial medial meniscectomy, debridement (Left) as a surgical intervention .  The patient's history has been reviewed, patient examined, no change in status, stable for surgery.  I have reviewed the patient's chart and labs.  Questions were answered to the patient's satisfaction.     [name]   

## 2018-12-07 NOTE — Transfer of Care (Signed)
  Last Vitals:  Vitals Value Taken Time  BP 156/128 12/07/2018 12:49 PM  Temp    Pulse 102 12/07/2018 12:51 PM  Resp 16 12/07/2018 12:51 PM  SpO2 95 % 12/07/2018 12:51 PM  Vitals shown include unvalidated device data.  Last Pain:  Vitals:   12/07/18 0924  TempSrc:   PainSc: 9       Patients Stated Pain Goal: 8 (12/07/18 0924)  Immediate Anesthesia Transfer of Care Note  Patient: Christopher [name]  Procedure(s) Performed: Procedure(s) (LRB): Left knee arthroscopy, partial medial meniscectomy, chondroplasty (Left)  Patient Location: PACU  Anesthesia Type: General  Level of Consciousness: awake, alert  and oriented  Airway & Oxygen Therapy: Patient Spontanous Breathing and Patient connected to nasal cannula oxygen  Post-op Assessment: Report given to PACU RN and Post -op Vital signs reviewed and stable  Post vital signs: Reviewed and stable  Complications: No apparent anesthesia complications

## 2018-12-07 NOTE — Brief Op Note (Signed)
12/07/2018  12:29 PM  PATIENT:  Christopher [name]  48 y.o. male  PRE-OPERATIVE DIAGNOSIS:  Left knee medial meniscal tear  [name]-OPERATIVE DIAGNOSIS:  Left knee medial meniscal tear  PROCEDURE:  Procedure(s): Left knee arthroscopy, partial medial meniscectomy, chondroplasty (Left)  SURGEON:  Surgeon(s) and Role:    Jene Every, MD - Primary  PHYSICIAN ASSISTANT:   ASSISTANTS: Bissell   ANESTHESIA:   general  EBL:  TD:HRCB  DRAINS: none   LOCAL MEDICATIONS USED:  MARCAINE     SPECIMEN:  No Specimen  DISPOSITION OF SPECIMEN:  N/A  COUNTS:  YES  TOURNIQUET:  * No tourniquets in log *  DICTATION: .Other Dictation: Dictation Number [id number]  PLAN OF CARE: Discharge to home after PACU  PATIENT DISPOSITION:  PACU - hemodynamically stable.   Delay start of Pharmacological VTE agent (>24hrs) due to surgical blood loss or risk of bleeding: no

## 2018-12-07 NOTE — Interval H&P Note (Signed)
History and Physical Interval Note:  12/07/2018 11:29 AM  Christopher [name]  has presented today for surgery, with the diagnosis of Left knee medial meniscal tear  The various methods of treatment have been discussed with the patient and family. After consideration of risks, benefits and other options for treatment, the patient has consented to  Procedure(s): Left knee arthroscopy, partial medial meniscectomy, debridement (Left) as a surgical intervention .  The patient's history has been reviewed, patient examined, no change in status, stable for surgery.  I have reviewed the patient's chart and labs.  Questions were answered to the patient's satisfaction.     Javier Docker

## 2018-12-07 NOTE — Discharge Instructions (Signed)
ARTHROSCOPIC KNEE SURGERY HOME CARE INSTRUCTIONS   PAIN You will be expected to have a moderate amount of pain in the affected knee for approximately two weeks.  However, the first two to four days will be the most severe in terms of the pain you will experience.  Prescriptions have been provided for you to take as needed for the pain.  The pain can be markedly reduced by using the ice/compressive bandage given.  Exchange the ice packs whenever they thaw.  During the night, keep the bandage on because it will still provide some compression for the swelling.  Also, keep the leg elevated on pillows above your heart, and this will help alleviate the pain and swelling.  MEDICATION Prescriptions have been provided to take as needed for pain. To prevent blood clots, take Aspirin 325mg  daily with a meal if not on a blood thinner and if no history of stomach ulcers.  ACTIVITY It is preferred that you stay on bedrest for approximately 24 hours.  However, you may go to the bathroom with help.  After this, you can start to be up and about progressively more.  Remember that the swelling may still increase after three to four days if you are up and doing too much.  You may put as much weight on the affected leg as pain will allow.  Use your crutches for comfort and safety.  However, as soon as you are able, you may discard the crutches and go without them.   DRESSING Keep the current dressing as dry as possible.  Two days after your surgery, you may remove the ice/compressive wrap, and surgical dressing.  You may now take a shower, but do not scrub the sounds directly with soap.  Let water rinse over these and gently wipe with your hand.  Reapply band-aids over the puncture wounds and more gauze if needed.  A slight amount of thin drainage can be normal at this time, and do not let it frighten you.  Reapply the ice/compressive wrap.  You may now repeat this every day each time you shower.  SYMPTOMS TO REPORT TO  YOUR DOCTOR  -Extreme pain.  -Extreme swelling.  -Temperature above 101 degrees that does not come down with acetaminophen     (Tylenol).  -Any changes in the feeling, color or movement of your toes.  -Extreme redness, heat, swelling or drainage at your incision  EXERCISE It is preferred that you begin to exercise on the day of your surgery.  Straight leg raises and short arc quads should be begun the afternoon or evening of surgery and continued until you come back for your follow-up appointment.   Attached is an instruction sheet on how to perform these two simple exercises.  Do these at least three times per day if not more.  You may bend your knee as much as is comfortable.  The puncture wounds may occasionally be slightly uncomfortable with bending of the knee.  Do not let this frighten you.  It is important to keep your knee motion, but do not overdo it.  If you have significant pain, simply do not bend the knee as far.   You will be given more exercises to perform at your first return visit.    RETURN APPOINTMENT Please make an appointment to be seen by your doctor in 14 days from your surgery.  Post Anesthesia Home Care Instructions  Activity: Get plenty of rest for the remainder of the day. A responsible individual must  stay with you for 24 hours following the procedure.  For the next 24 hours, DO NOT: -Drive a car -Advertising copywriterperate machinery -Drink alcoholic beverages -Take any medication unless instructed by your physician -Make any legal decisions or sign important papers.  Meals: Start with liquid foods such as gelatin or soup. Progress to regular foods as tolerated. Avoid greasy, spicy, heavy foods. If nausea and/or vomiting occur, drink only clear liquids until the nausea and/or vomiting subsides. Call your physician if vomiting continues.  Special Instructions/Symptoms: Your throat may feel dry or sore from the anesthesia or the breathing tube placed in your throat during surgery.  If this causes discomfort, gargle with warm salt water. The discomfort should disappear within 24 hours.  May take Ibuprofen, Advil, Motrin, or Aleve after 6:30 PM.

## 2018-12-08 ENCOUNTER — Encounter (HOSPITAL_BASED_OUTPATIENT_CLINIC_OR_DEPARTMENT_OTHER): Payer: Self-pay | Admitting: Specialist

## 2018-12-08 NOTE — Anesthesia Postprocedure Evaluation (Signed)
Anesthesia Post Note  Patient: Christopher [name]  Procedure(s) Performed: Left knee arthroscopy, partial medial meniscectomy, chondroplasty (Left Knee)     Patient location during evaluation: PACU Anesthesia Type: General Level of consciousness: awake and alert Pain management: pain level controlled Vital Signs Assessment: post-procedure vital signs reviewed and stable Respiratory status: spontaneous breathing, nonlabored ventilation, respiratory function stable and patient connected to nasal cannula oxygen Cardiovascular status: blood pressure returned to baseline and stable Postop Assessment: no apparent nausea or vomiting Anesthetic complications: no    Last Vitals:  Vitals:   12/07/18 1345 12/07/18 1425  BP: (!) 128/96 128/88  Pulse: 79 81  Resp: 11 12  Temp:  37.3 C  SpO2: 96% 92%    Last Pain:  Vitals:   12/07/18 1425  TempSrc:   PainSc: 4                  Mylon Mabey S

## 2018-12-08 NOTE — Op Note (Signed)
NAME: Christopher [name], HIBDON MEDICAL RECORD [id number] ACCOUNT [id number] DATE OF BIRTH:Feb 07, 1971 FACILITY: [facility] LOCATION: [facility] PHYSICIAN:Scotlyn Mccranie Connye Burkitt, MD  OPERATIVE REPORT  DATE OF PROCEDURE:  12/07/2018  PREOPERATIVE DIAGNOSES:  Medial meniscus tear, left knee.  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  PROCEDURES PERFORMED:  Left knee arthroscopy, partial medial meniscectomy, chondroplasty of medial femoral condyle and medial tibial plateau.  ANESTHESIA:  General.  ASSISTANT:  Andrez Grime, PA  HISTORY:  A 48 year old locking, popping, giving way after an injury, swelling.  MRI indicating medial meniscus tear, a fracture rest, activity modification to mechanical symptoms, presence of a meniscus tear.  We discussed knee arthroscopy, partial  meniscectomy.  Risks and benefits discussed including bleeding, infection, damage to neurovascular structures, no change in symptoms, worsening symptoms, DVT, PE, anesthetic complications, etc.  DESCRIPTION OF PROCEDURE:  With the patient in supine position.  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  Left lower extremity was prepped and draped in the usual sterile fashion.  Lateral parapatellar  portal was fashioned with a #11 blade and gross cannula atraumatically placed.  Irrigant was utilized to insufflate the joint.  Under direct visualization, a medial parapatellar portal was fashioned with a #11 blade after localization with an 18-gauge  needle sparing medial meniscus.  Moderate clear synovial fluid was evacuated.  Examination of the medial compartment revealed grade III changes tibial plateau minor of the femoral condyle.  The meniscus tear in the posterior third extending into the  root.  We introduced a small upbiting basket resected to a stable base proximal one-third and posterior third was excised.  This was  further contoured with a shaver.  Light chondroplasty of femoral condyle and tibial plateau was noted.  The remnant  meniscus was stable to probe palpation.  ACL was unremarkable.  Lateral compartment revealed normal femoral condyle, tibial plateau and meniscus stable to probe palpation without evidence of tear.  Suprapatellar pouch, grade II-III changes of the patella.  Normal patellofemoral tracking.  Gutters were unremarkable.  The sulcus was unremarkable.  We revisited all compartments.  No further pathology amenable to arthroscopic intervention and therefore removed all instrumentation.  Portals were closed with 4-0 nylon simple sutures.  Marcaine 0.25% with epinephrine was infiltrated in joint.  Wound  was dressed sterilely.  Awoken without difficulty and transported to the recovery room in satisfactory condition.  The patient tolerated the procedure well.  No complications.  Assistant Andrez Grime, [place].  Minimal blood loss.  Also, patient received Kefzol preoperatively.  AN/NUANCE  D:12/07/2018 T:12/08/2018 JOB:[id number]

## 2019-01-04 ENCOUNTER — Ambulatory Visit: Payer: Commercial Managed Care - PPO | Admitting: Diagnostic Neuroimaging

## 2019-02-23 ENCOUNTER — Other Ambulatory Visit: Payer: Commercial Managed Care - PPO

## 2019-02-23 ENCOUNTER — Ambulatory Visit: Payer: Commercial Managed Care - PPO | Admitting: Hematology

## 2019-06-09 ENCOUNTER — Other Ambulatory Visit: Payer: Self-pay

## 2019-06-09 DIAGNOSIS — Z20822 Contact with and (suspected) exposure to covid-19: Secondary | ICD-10-CM

## 2019-06-10 LAB — SPECIMEN STATUS REPORT

## 2019-06-10 LAB — NOVEL CORONAVIRUS, NAA: SARS-CoV-2, NAA: NOT DETECTED

## 2019-08-30 ENCOUNTER — Other Ambulatory Visit: Payer: Self-pay

## 2019-08-30 DIAGNOSIS — Z20822 Contact with and (suspected) exposure to covid-19: Secondary | ICD-10-CM

## 2019-08-31 LAB — NOVEL CORONAVIRUS, NAA: SARS-CoV-2, NAA: NOT DETECTED

## 2019-09-08 ENCOUNTER — Telehealth: Payer: Self-pay | Admitting: Family Medicine

## 2019-09-08 NOTE — Telephone Encounter (Signed)
Patient is calling to receive his negative COVID results. Patient expressed understanding. °

## 2019-10-06 ENCOUNTER — Other Ambulatory Visit: Payer: Self-pay | Admitting: *Deleted

## 2019-10-06 DIAGNOSIS — Z20822 Contact with and (suspected) exposure to covid-19: Secondary | ICD-10-CM

## 2019-10-09 LAB — NOVEL CORONAVIRUS, NAA: SARS-CoV-2, NAA: NOT DETECTED

## 2019-10-31 ENCOUNTER — Ambulatory Visit: Payer: Self-pay | Attending: Internal Medicine

## 2019-10-31 DIAGNOSIS — Z20822 Contact with and (suspected) exposure to covid-19: Secondary | ICD-10-CM

## 2019-10-31 DIAGNOSIS — Z20828 Contact with and (suspected) exposure to other viral communicable diseases: Secondary | ICD-10-CM | POA: Insufficient documentation

## 2019-11-01 LAB — NOVEL CORONAVIRUS, NAA: SARS-CoV-2, NAA: NOT DETECTED

## 2019-11-03 ENCOUNTER — Ambulatory Visit (HOSPITAL_COMMUNITY)
Admission: EM | Admit: 2019-11-03 | Discharge: 2019-11-03 | Disposition: A | Discharge status: Home / Self Care | Payer: HRSA Program | Attending: Family Medicine | Admitting: Family Medicine

## 2019-11-03 ENCOUNTER — Ambulatory Visit (INDEPENDENT_AMBULATORY_CARE_PROVIDER_SITE_OTHER): Payer: HRSA Program

## 2019-11-03 ENCOUNTER — Other Ambulatory Visit: Payer: Self-pay

## 2019-11-03 ENCOUNTER — Encounter (HOSPITAL_COMMUNITY): Payer: Self-pay | Admitting: Emergency Medicine

## 2019-11-03 DIAGNOSIS — Z8673 Personal history of transient ischemic attack (TIA), and cerebral infarction without residual deficits: Secondary | ICD-10-CM | POA: Diagnosis not present

## 2019-11-03 DIAGNOSIS — J45909 Unspecified asthma, uncomplicated: Secondary | ICD-10-CM | POA: Insufficient documentation

## 2019-11-03 DIAGNOSIS — R059 Cough, unspecified: Secondary | ICD-10-CM

## 2019-11-03 DIAGNOSIS — R05 Cough: Secondary | ICD-10-CM | POA: Insufficient documentation

## 2019-11-03 DIAGNOSIS — Z87891 Personal history of nicotine dependence: Secondary | ICD-10-CM | POA: Diagnosis not present

## 2019-11-03 DIAGNOSIS — Z20822 Contact with and (suspected) exposure to covid-19: Secondary | ICD-10-CM | POA: Diagnosis not present

## 2019-11-03 DIAGNOSIS — Z79899 Other long term (current) drug therapy: Secondary | ICD-10-CM | POA: Diagnosis not present

## 2019-11-03 DIAGNOSIS — D696 Thrombocytopenia, unspecified: Secondary | ICD-10-CM | POA: Diagnosis not present

## 2019-11-03 DIAGNOSIS — K219 Gastro-esophageal reflux disease without esophagitis: Secondary | ICD-10-CM | POA: Insufficient documentation

## 2019-11-03 DIAGNOSIS — I10 Essential (primary) hypertension: Secondary | ICD-10-CM | POA: Insufficient documentation

## 2019-11-03 DIAGNOSIS — M791 Myalgia, unspecified site: Secondary | ICD-10-CM

## 2019-11-03 LAB — POC SARS CORONAVIRUS 2 AG -  ED: SARS Coronavirus 2 Ag: NEGATIVE

## 2019-11-03 LAB — POC SARS CORONAVIRUS 2 AG: SARS Coronavirus 2 Ag: NEGATIVE

## 2019-11-03 MED ORDER — METHYLPREDNISOLONE ACETATE 40 MG/ML IJ SUSP
INTRAMUSCULAR | Status: AC
  Filled 2019-11-03: qty 1

## 2019-11-03 MED ORDER — METHYLPREDNISOLONE ACETATE 40 MG/ML IJ SUSP
40.0000 mg | Freq: Once | INTRAMUSCULAR | Status: AC
  Administered 2019-11-03: 13:00:00 40 mg via INTRAMUSCULAR
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
## 2019-11-03 NOTE — ED Triage Notes (Signed)
Pt hx of asthma, states hes been up half the night with shortness of breath. Coughing, headache, congestion. States congestion has been since last Thursday. Pt states he has an albuterol pump, has not helped.

## 2019-11-03 NOTE — Discharge Instructions (Signed)
Please try the albuterol every 4-6 hours  Please follow up if your symptoms fail to improve.

## 2019-11-05 LAB — NOVEL CORONAVIRUS, NAA (HOSP ORDER, SEND-OUT TO REF LAB; TAT 18-24 HRS): SARS-CoV-2, NAA: NOT DETECTED

## 2019-11-13 ENCOUNTER — Other Ambulatory Visit: Payer: Self-pay

## 2019-11-13 ENCOUNTER — Ambulatory Visit
Admission: RE | Admit: 2019-11-13 | Discharge: 2019-11-13 | Disposition: A | Discharge status: Home / Self Care | Payer: No Typology Code available for payment source | Source: Ambulatory Visit | Attending: Family Medicine | Admitting: Family Medicine

## 2019-11-13 ENCOUNTER — Other Ambulatory Visit: Payer: Self-pay | Admitting: Family Medicine

## 2019-11-13 DIAGNOSIS — R059 Cough, unspecified: Secondary | ICD-10-CM

## 2019-11-13 DIAGNOSIS — R0602 Shortness of breath: Secondary | ICD-10-CM

## 2019-11-13 DIAGNOSIS — R05 Cough: Secondary | ICD-10-CM

## 2019-11-30 ENCOUNTER — Ambulatory Visit: Payer: Self-pay | Attending: Internal Medicine

## 2019-11-30 DIAGNOSIS — Z20822 Contact with and (suspected) exposure to covid-19: Secondary | ICD-10-CM | POA: Insufficient documentation

## 2019-12-01 LAB — NOVEL CORONAVIRUS, NAA: SARS-CoV-2, NAA: NOT DETECTED

## 2020-06-18 ENCOUNTER — Emergency Department (HOSPITAL_COMMUNITY): Payer: Self-pay

## 2020-06-18 ENCOUNTER — Encounter (HOSPITAL_COMMUNITY): Payer: Self-pay | Admitting: Emergency Medicine

## 2020-06-18 ENCOUNTER — Emergency Department (HOSPITAL_COMMUNITY)
Admission: EM | Admit: 2020-06-18 | Discharge: 2020-06-19 | Disposition: A | Discharge status: Home / Self Care | Payer: Self-pay | Attending: Emergency Medicine | Admitting: Emergency Medicine

## 2020-06-18 DIAGNOSIS — R1013 Epigastric pain: Secondary | ICD-10-CM | POA: Insufficient documentation

## 2020-06-18 DIAGNOSIS — Z5321 Procedure and treatment not carried out due to patient leaving prior to being seen by health care provider: Secondary | ICD-10-CM | POA: Insufficient documentation

## 2020-06-18 DIAGNOSIS — R0602 Shortness of breath: Secondary | ICD-10-CM | POA: Insufficient documentation

## 2020-06-18 LAB — CBC
HCT: 48.6 % (ref 39.0–52.0)
Hemoglobin: 16.2 g/dL (ref 13.0–17.0)
MCH: 30.2 pg (ref 26.0–34.0)
MCHC: 33.3 g/dL (ref 30.0–36.0)
MCV: 90.5 fL (ref 80.0–100.0)
Platelets: 108 10*3/uL — ABNORMAL LOW (ref 150–400)
RBC: 5.37 MIL/uL (ref 4.22–5.81)
RDW: 13.2 % (ref 11.5–15.5)
WBC: 8 10*3/uL (ref 4.0–10.5)
nRBC: 0 % (ref 0.0–0.2)

## 2020-06-18 LAB — COMPREHENSIVE METABOLIC PANEL
ALT: 65 U/L — ABNORMAL HIGH (ref 0–44)
AST: 37 U/L (ref 15–41)
Albumin: 3.8 g/dL (ref 3.5–5.0)
Alkaline Phosphatase: 59 U/L (ref 38–126)
Anion gap: 8 (ref 5–15)
BUN: 12 mg/dL (ref 6–20)
CO2: 25 mmol/L (ref 22–32)
Calcium: 9.1 mg/dL (ref 8.9–10.3)
Chloride: 106 mmol/L (ref 98–111)
Creatinine, Ser: 0.94 mg/dL (ref 0.61–1.24)
GFR calc Af Amer: >60 mL/min (ref >60)
GFR calc non Af Amer: >60 mL/min (ref >60)
Glucose, Bld: 118 mg/dL — ABNORMAL HIGH (ref 70–99)
Potassium: 3.9 mmol/L (ref 3.5–5.1)
Sodium: 139 mmol/L (ref 135–145)
Total Bilirubin: 0.9 mg/dL (ref 0.3–1.2)
Total Protein: 7 g/dL (ref 6.5–8.1)

## 2020-06-18 LAB — TROPONIN I (HIGH SENSITIVITY)
Troponin I (High Sensitivity): 4 ng/L (ref <18)
Troponin I (High Sensitivity): 5 ng/L (ref <18)

## 2020-06-18 LAB — LIPASE, BLOOD: Lipase: 29 U/L (ref 11–51)

## 2020-06-18 MED ORDER — IBUPROFEN 400 MG PO TABS
400.0000 mg | ORAL_TABLET | Freq: Once | ORAL | Status: AC | PRN
  Administered 2020-06-18: 400 mg via ORAL
  Filled 2020-06-18: qty 1

## 2020-06-18 NOTE — ED Triage Notes (Signed)
Patient reports CP, SOB, and a "burning" sensation in epigastric area since Saturday.

## 2020-06-18 NOTE — ED Notes (Signed)
Pt called someone requesting to be picked up, walked outside.

## 2020-06-19 NOTE — ED Notes (Signed)
No answer for vitals x2 

## 2020-06-19 NOTE — ED Notes (Signed)
No answer for vitals recheck x1 

## 2021-04-29 DIAGNOSIS — M65872 Other synovitis and tenosynovitis, left ankle and foot: Secondary | ICD-10-CM | POA: Diagnosis not present

## 2021-04-29 DIAGNOSIS — M12272 Villonodular synovitis (pigmented), left ankle and foot: Secondary | ICD-10-CM | POA: Diagnosis not present

## 2021-04-29 DIAGNOSIS — M79672 Pain in left foot: Secondary | ICD-10-CM | POA: Diagnosis not present

## 2021-04-29 DIAGNOSIS — M25572 Pain in left ankle and joints of left foot: Secondary | ICD-10-CM | POA: Diagnosis not present

## 2021-05-06 DIAGNOSIS — M65872 Other synovitis and tenosynovitis, left ankle and foot: Secondary | ICD-10-CM | POA: Diagnosis not present

## 2021-05-06 DIAGNOSIS — M25572 Pain in left ankle and joints of left foot: Secondary | ICD-10-CM | POA: Diagnosis not present

## 2021-05-20 DIAGNOSIS — M12272 Villonodular synovitis (pigmented), left ankle and foot: Secondary | ICD-10-CM | POA: Diagnosis not present

## 2021-05-27 DIAGNOSIS — H6690 Otitis media, unspecified, unspecified ear: Secondary | ICD-10-CM | POA: Diagnosis not present

## 2021-05-27 DIAGNOSIS — J329 Chronic sinusitis, unspecified: Secondary | ICD-10-CM | POA: Diagnosis not present

## 2021-05-27 DIAGNOSIS — G4700 Insomnia, unspecified: Secondary | ICD-10-CM | POA: Diagnosis not present

## 2021-05-27 DIAGNOSIS — J988 Other specified respiratory disorders: Secondary | ICD-10-CM | POA: Diagnosis not present

## 2021-08-20 DIAGNOSIS — K921 Melena: Secondary | ICD-10-CM | POA: Diagnosis not present

## 2021-08-27 DIAGNOSIS — Z125 Encounter for screening for malignant neoplasm of prostate: Secondary | ICD-10-CM | POA: Diagnosis not present

## 2021-08-27 DIAGNOSIS — Z79899 Other long term (current) drug therapy: Secondary | ICD-10-CM | POA: Diagnosis not present

## 2021-08-27 DIAGNOSIS — I10 Essential (primary) hypertension: Secondary | ICD-10-CM | POA: Diagnosis not present

## 2021-08-27 DIAGNOSIS — R946 Abnormal results of thyroid function studies: Secondary | ICD-10-CM | POA: Diagnosis not present

## 2021-08-27 DIAGNOSIS — R7301 Impaired fasting glucose: Secondary | ICD-10-CM | POA: Diagnosis not present

## 2021-09-03 DIAGNOSIS — M791 Myalgia, unspecified site: Secondary | ICD-10-CM | POA: Diagnosis not present

## 2021-09-03 DIAGNOSIS — Z0000 Encounter for general adult medical examination without abnormal findings: Secondary | ICD-10-CM | POA: Diagnosis not present

## 2021-09-03 DIAGNOSIS — E119 Type 2 diabetes mellitus without complications: Secondary | ICD-10-CM | POA: Diagnosis not present

## 2021-09-03 DIAGNOSIS — I10 Essential (primary) hypertension: Secondary | ICD-10-CM | POA: Diagnosis not present

## 2021-09-04 DIAGNOSIS — K625 Hemorrhage of anus and rectum: Secondary | ICD-10-CM | POA: Diagnosis not present

## 2021-09-04 DIAGNOSIS — K219 Gastro-esophageal reflux disease without esophagitis: Secondary | ICD-10-CM | POA: Diagnosis not present

## 2021-10-29 DIAGNOSIS — R918 Other nonspecific abnormal finding of lung field: Secondary | ICD-10-CM | POA: Diagnosis not present

## 2021-10-29 DIAGNOSIS — R4182 Altered mental status, unspecified: Secondary | ICD-10-CM | POA: Diagnosis not present

## 2021-10-29 DIAGNOSIS — M25531 Pain in right wrist: Secondary | ICD-10-CM | POA: Diagnosis not present

## 2021-10-29 DIAGNOSIS — F1210 Cannabis abuse, uncomplicated: Secondary | ICD-10-CM | POA: Diagnosis not present

## 2021-10-29 DIAGNOSIS — R000 Tachycardia, unspecified: Secondary | ICD-10-CM | POA: Diagnosis not present

## 2021-10-29 DIAGNOSIS — M25571 Pain in right ankle and joints of right foot: Secondary | ICD-10-CM | POA: Diagnosis not present

## 2021-10-29 DIAGNOSIS — F1410 Cocaine abuse, uncomplicated: Secondary | ICD-10-CM | POA: Diagnosis not present

## 2021-10-29 DIAGNOSIS — R410 Disorientation, unspecified: Secondary | ICD-10-CM | POA: Diagnosis not present

## 2021-10-29 DIAGNOSIS — F1910 Other psychoactive substance abuse, uncomplicated: Secondary | ICD-10-CM | POA: Diagnosis not present

## 2021-10-29 DIAGNOSIS — F419 Anxiety disorder, unspecified: Secondary | ICD-10-CM | POA: Diagnosis not present

## 2021-10-29 DIAGNOSIS — M25572 Pain in left ankle and joints of left foot: Secondary | ICD-10-CM | POA: Diagnosis not present

## 2021-10-29 DIAGNOSIS — R531 Weakness: Secondary | ICD-10-CM | POA: Diagnosis not present

## 2021-10-29 DIAGNOSIS — M1711 Unilateral primary osteoarthritis, right knee: Secondary | ICD-10-CM | POA: Diagnosis not present

## 2021-10-29 DIAGNOSIS — Z20822 Contact with and (suspected) exposure to covid-19: Secondary | ICD-10-CM | POA: Diagnosis not present

## 2021-10-29 DIAGNOSIS — I10 Essential (primary) hypertension: Secondary | ICD-10-CM | POA: Diagnosis not present

## 2021-10-30 DIAGNOSIS — M25572 Pain in left ankle and joints of left foot: Secondary | ICD-10-CM | POA: Diagnosis not present

## 2021-10-30 DIAGNOSIS — M25531 Pain in right wrist: Secondary | ICD-10-CM | POA: Diagnosis not present

## 2021-10-30 DIAGNOSIS — F1910 Other psychoactive substance abuse, uncomplicated: Secondary | ICD-10-CM | POA: Diagnosis not present

## 2021-10-30 DIAGNOSIS — M1711 Unilateral primary osteoarthritis, right knee: Secondary | ICD-10-CM | POA: Diagnosis not present

## 2021-10-30 DIAGNOSIS — M25571 Pain in right ankle and joints of right foot: Secondary | ICD-10-CM | POA: Diagnosis not present

## 2021-10-30 DIAGNOSIS — F411 Generalized anxiety disorder: Secondary | ICD-10-CM | POA: Diagnosis not present

## 2021-11-02 DIAGNOSIS — F32A Depression, unspecified: Secondary | ICD-10-CM | POA: Diagnosis not present

## 2021-11-03 DIAGNOSIS — F32A Depression, unspecified: Secondary | ICD-10-CM | POA: Diagnosis not present

## 2021-11-07 DIAGNOSIS — F39 Unspecified mood [affective] disorder: Secondary | ICD-10-CM | POA: Diagnosis not present

## 2021-11-07 DIAGNOSIS — G4700 Insomnia, unspecified: Secondary | ICD-10-CM | POA: Diagnosis not present

## 2021-11-07 DIAGNOSIS — Z09 Encounter for follow-up examination after completed treatment for conditions other than malignant neoplasm: Secondary | ICD-10-CM | POA: Diagnosis not present

## 2021-11-11 ENCOUNTER — Telehealth (HOSPITAL_COMMUNITY): Payer: Self-pay | Admitting: Psychiatry

## 2021-11-11 NOTE — Telephone Encounter (Signed)
D:  Pt called stating his PCP ([place]) referred him to see Dr. Dwyane Dee.  A:  Explained to pt that Dr. Dwyane Dee would like for the case manager to discuss group (MH-IOP/PHP) with him.  Before case manager could orient pt, he stated that he is not interested in group.  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  I won't be attending any group."  Inquired if it was individual counseling he was looking for?  Pt states he wants one on one counseling instead and wants to see a psychiatrist.  "I was given the name Dr. Nicole Cella also."  Informed pt that he would need to get a referral sent from his PCP.  Case manager will reach out to [place] at [facility] and give her an update.  Will also, inform Dr. Dwyane Dee of disposition.

## 2021-11-13 DIAGNOSIS — S6990XA Unspecified injury of unspecified wrist, hand and finger(s), initial encounter: Secondary | ICD-10-CM | POA: Diagnosis not present

## 2021-11-13 DIAGNOSIS — T424X2D Poisoning by benzodiazepines, intentional self-harm, subsequent encounter: Secondary | ICD-10-CM | POA: Diagnosis not present

## 2021-11-13 DIAGNOSIS — F39 Unspecified mood [affective] disorder: Secondary | ICD-10-CM | POA: Diagnosis not present

## 2021-11-13 DIAGNOSIS — G4700 Insomnia, unspecified: Secondary | ICD-10-CM | POA: Diagnosis not present

## 2021-11-13 DIAGNOSIS — T1491XD Suicide attempt, subsequent encounter: Secondary | ICD-10-CM | POA: Diagnosis not present

## 2021-11-14 DIAGNOSIS — F3181 Bipolar II disorder: Secondary | ICD-10-CM | POA: Diagnosis not present

## 2021-12-03 DIAGNOSIS — F319 Bipolar disorder, unspecified: Secondary | ICD-10-CM | POA: Diagnosis not present

## 2021-12-05 DIAGNOSIS — E785 Hyperlipidemia, unspecified: Secondary | ICD-10-CM | POA: Diagnosis not present

## 2021-12-05 DIAGNOSIS — R748 Abnormal levels of other serum enzymes: Secondary | ICD-10-CM | POA: Diagnosis not present

## 2021-12-05 DIAGNOSIS — D696 Thrombocytopenia, unspecified: Secondary | ICD-10-CM | POA: Diagnosis not present

## 2021-12-05 DIAGNOSIS — E119 Type 2 diabetes mellitus without complications: Secondary | ICD-10-CM | POA: Diagnosis not present

## 2021-12-05 DIAGNOSIS — Z79899 Other long term (current) drug therapy: Secondary | ICD-10-CM | POA: Diagnosis not present

## 2021-12-10 DIAGNOSIS — F319 Bipolar disorder, unspecified: Secondary | ICD-10-CM | POA: Diagnosis not present

## 2022-06-29 DIAGNOSIS — R051 Acute cough: Secondary | ICD-10-CM | POA: Diagnosis not present

## 2022-06-29 DIAGNOSIS — H6691 Otitis media, unspecified, right ear: Secondary | ICD-10-CM | POA: Diagnosis not present

## 2022-06-29 DIAGNOSIS — R0981 Nasal congestion: Secondary | ICD-10-CM | POA: Diagnosis not present

## 2022-06-29 DIAGNOSIS — J029 Acute pharyngitis, unspecified: Secondary | ICD-10-CM | POA: Diagnosis not present

## 2022-09-22 DIAGNOSIS — E781 Pure hyperglyceridemia: Secondary | ICD-10-CM | POA: Diagnosis not present

## 2022-09-22 DIAGNOSIS — D696 Thrombocytopenia, unspecified: Secondary | ICD-10-CM | POA: Diagnosis not present

## 2022-09-22 DIAGNOSIS — Z125 Encounter for screening for malignant neoplasm of prostate: Secondary | ICD-10-CM | POA: Diagnosis not present

## 2022-09-22 DIAGNOSIS — R946 Abnormal results of thyroid function studies: Secondary | ICD-10-CM | POA: Diagnosis not present

## 2022-09-22 DIAGNOSIS — R748 Abnormal levels of other serum enzymes: Secondary | ICD-10-CM | POA: Diagnosis not present

## 2022-09-22 DIAGNOSIS — E119 Type 2 diabetes mellitus without complications: Secondary | ICD-10-CM | POA: Diagnosis not present

## 2022-09-22 DIAGNOSIS — Z79899 Other long term (current) drug therapy: Secondary | ICD-10-CM | POA: Diagnosis not present

## 2022-09-28 DIAGNOSIS — E781 Pure hyperglyceridemia: Secondary | ICD-10-CM | POA: Diagnosis not present

## 2022-09-28 DIAGNOSIS — Z0000 Encounter for general adult medical examination without abnormal findings: Secondary | ICD-10-CM | POA: Diagnosis not present

## 2022-09-28 DIAGNOSIS — E119 Type 2 diabetes mellitus without complications: Secondary | ICD-10-CM | POA: Diagnosis not present

## 2022-09-28 DIAGNOSIS — F39 Unspecified mood [affective] disorder: Secondary | ICD-10-CM | POA: Diagnosis not present

## 2022-11-18 DIAGNOSIS — F319 Bipolar disorder, unspecified: Secondary | ICD-10-CM | POA: Diagnosis not present

## 2022-12-12 ENCOUNTER — Emergency Department (HOSPITAL_COMMUNITY): Payer: BC Managed Care – PPO

## 2022-12-12 ENCOUNTER — Encounter (HOSPITAL_COMMUNITY): Payer: Self-pay

## 2022-12-12 ENCOUNTER — Inpatient Hospital Stay (HOSPITAL_COMMUNITY): Payer: BC Managed Care – PPO

## 2022-12-12 ENCOUNTER — Inpatient Hospital Stay (HOSPITAL_COMMUNITY)
Admission: EM | Admit: 2022-12-12 | Discharge: 2022-12-15 | DRG: 270 | Disposition: A | Discharge status: Home / Self Care | Payer: BC Managed Care – PPO | Attending: Cardiovascular Disease | Admitting: Cardiovascular Disease

## 2022-12-12 ENCOUNTER — Encounter (HOSPITAL_COMMUNITY)
Admission: EM | Disposition: A | Discharge status: Home / Self Care | Payer: Self-pay | Source: Home / Self Care | Attending: Cardiovascular Disease

## 2022-12-12 DIAGNOSIS — I213 ST elevation (STEMI) myocardial infarction of unspecified site: Secondary | ICD-10-CM | POA: Diagnosis not present

## 2022-12-12 DIAGNOSIS — F319 Bipolar disorder, unspecified: Secondary | ICD-10-CM | POA: Diagnosis not present

## 2022-12-12 DIAGNOSIS — F4310 Post-traumatic stress disorder, unspecified: Secondary | ICD-10-CM | POA: Diagnosis present

## 2022-12-12 DIAGNOSIS — I110 Hypertensive heart disease with heart failure: Secondary | ICD-10-CM | POA: Diagnosis present

## 2022-12-12 DIAGNOSIS — E1010 Type 1 diabetes mellitus with ketoacidosis without coma: Secondary | ICD-10-CM | POA: Diagnosis not present

## 2022-12-12 DIAGNOSIS — I472 Ventricular tachycardia, unspecified: Secondary | ICD-10-CM | POA: Diagnosis not present

## 2022-12-12 DIAGNOSIS — Z8673 Personal history of transient ischemic attack (TIA), and cerebral infarction without residual deficits: Secondary | ICD-10-CM

## 2022-12-12 DIAGNOSIS — R570 Cardiogenic shock: Secondary | ICD-10-CM | POA: Diagnosis not present

## 2022-12-12 DIAGNOSIS — I462 Cardiac arrest due to underlying cardiac condition: Secondary | ICD-10-CM | POA: Diagnosis not present

## 2022-12-12 DIAGNOSIS — K219 Gastro-esophageal reflux disease without esophagitis: Secondary | ICD-10-CM | POA: Diagnosis present

## 2022-12-12 DIAGNOSIS — F17290 Nicotine dependence, other tobacco product, uncomplicated: Secondary | ICD-10-CM | POA: Diagnosis present

## 2022-12-12 DIAGNOSIS — Z832 Family history of diseases of the blood and blood-forming organs and certain disorders involving the immune mechanism: Secondary | ICD-10-CM

## 2022-12-12 DIAGNOSIS — I4891 Unspecified atrial fibrillation: Secondary | ICD-10-CM | POA: Diagnosis not present

## 2022-12-12 DIAGNOSIS — I493 Ventricular premature depolarization: Secondary | ICD-10-CM | POA: Diagnosis present

## 2022-12-12 DIAGNOSIS — Z79899 Other long term (current) drug therapy: Secondary | ICD-10-CM | POA: Diagnosis not present

## 2022-12-12 DIAGNOSIS — I4901 Ventricular fibrillation: Secondary | ICD-10-CM | POA: Diagnosis present

## 2022-12-12 DIAGNOSIS — F1010 Alcohol abuse, uncomplicated: Secondary | ICD-10-CM | POA: Diagnosis present

## 2022-12-12 DIAGNOSIS — F1410 Cocaine abuse, uncomplicated: Secondary | ICD-10-CM | POA: Diagnosis not present

## 2022-12-12 DIAGNOSIS — D696 Thrombocytopenia, unspecified: Secondary | ICD-10-CM | POA: Diagnosis not present

## 2022-12-12 DIAGNOSIS — I5021 Acute systolic (congestive) heart failure: Secondary | ICD-10-CM | POA: Diagnosis not present

## 2022-12-12 DIAGNOSIS — Z955 Presence of coronary angioplasty implant and graft: Secondary | ICD-10-CM

## 2022-12-12 DIAGNOSIS — I2102 ST elevation (STEMI) myocardial infarction involving left anterior descending coronary artery: Secondary | ICD-10-CM | POA: Diagnosis not present

## 2022-12-12 DIAGNOSIS — Z981 Arthrodesis status: Secondary | ICD-10-CM

## 2022-12-12 DIAGNOSIS — I2510 Atherosclerotic heart disease of native coronary artery without angina pectoris: Secondary | ICD-10-CM | POA: Diagnosis not present

## 2022-12-12 HISTORY — PX: IABP INSERTION: CATH118242

## 2022-12-12 HISTORY — PX: LEFT HEART CATH AND CORONARY ANGIOGRAPHY: CATH118249

## 2022-12-12 HISTORY — PX: CORONARY/GRAFT ACUTE MI REVASCULARIZATION: CATH118305

## 2022-12-12 LAB — BASIC METABOLIC PANEL
Anion gap: 13 (ref 5–15)
BUN: 9 mg/dL (ref 6–20)
CO2: 21 mmol/L — ABNORMAL LOW (ref 22–32)
Calcium: 8.9 mg/dL (ref 8.9–10.3)
Chloride: 104 mmol/L (ref 98–111)
Creatinine, Ser: 1.16 mg/dL (ref 0.61–1.24)
GFR, Estimated: >60 mL/min (ref >60)
Glucose, Bld: 164 mg/dL — ABNORMAL HIGH (ref 70–99)
Potassium: 3.7 mmol/L (ref 3.5–5.1)
Sodium: 138 mmol/L (ref 135–145)

## 2022-12-12 LAB — COMPREHENSIVE METABOLIC PANEL
ALT: 62 U/L — ABNORMAL HIGH (ref 0–44)
AST: 50 U/L — ABNORMAL HIGH (ref 15–41)
Albumin: 3.5 g/dL (ref 3.5–5.0)
Alkaline Phosphatase: 63 U/L (ref 38–126)
Anion gap: 15 (ref 5–15)
BUN: 8 mg/dL (ref 6–20)
CO2: 18 mmol/L — ABNORMAL LOW (ref 22–32)
Calcium: 8 mg/dL — ABNORMAL LOW (ref 8.9–10.3)
Chloride: 107 mmol/L (ref 98–111)
Creatinine, Ser: 1.4 mg/dL — ABNORMAL HIGH (ref 0.61–1.24)
GFR, Estimated: >60 mL/min (ref >60)
Glucose, Bld: 128 mg/dL — ABNORMAL HIGH (ref 70–99)
Potassium: 2.8 mmol/L — ABNORMAL LOW (ref 3.5–5.1)
Sodium: 140 mmol/L (ref 135–145)
Total Bilirubin: 0.9 mg/dL (ref 0.3–1.2)
Total Protein: 6.2 g/dL — ABNORMAL LOW (ref 6.5–8.1)

## 2022-12-12 LAB — CBC WITH DIFFERENTIAL/PLATELET
Abs Immature Granulocytes: 0 10*3/uL (ref 0.00–0.07)
Basophils Absolute: 0 10*3/uL (ref 0.0–0.1)
Basophils Relative: 0 %
Eosinophils Absolute: 0.5 10*3/uL (ref 0.0–0.5)
Eosinophils Relative: 4 %
HCT: 49.2 % (ref 39.0–52.0)
Hemoglobin: 16.3 g/dL (ref 13.0–17.0)
Lymphocytes Relative: 51 %
Lymphs Abs: 6.3 10*3/uL — ABNORMAL HIGH (ref 0.7–4.0)
MCH: 29.6 pg (ref 26.0–34.0)
MCHC: 33.1 g/dL (ref 30.0–36.0)
MCV: 89.5 fL (ref 80.0–100.0)
Monocytes Absolute: 0.5 10*3/uL (ref 0.1–1.0)
Monocytes Relative: 4 %
Neutro Abs: 5.1 10*3/uL (ref 1.7–7.7)
Neutrophils Relative %: 41 %
Platelets: 147 10*3/uL — ABNORMAL LOW (ref 150–400)
RBC: 5.5 MIL/uL (ref 4.22–5.81)
RDW: 12.5 % (ref 11.5–15.5)
WBC: 12.4 10*3/uL — ABNORMAL HIGH (ref 4.0–10.5)
nRBC: 0 % (ref 0.0–0.2)
nRBC: 0 /100 WBC

## 2022-12-12 LAB — CBG MONITORING, ED: Glucose-Capillary: 98 mg/dL (ref 70–99)

## 2022-12-12 LAB — MRSA NEXT GEN BY PCR, NASAL: MRSA by PCR Next Gen: NOT DETECTED

## 2022-12-12 LAB — PROTIME-INR
INR: 1.5 — ABNORMAL HIGH (ref 0.8–1.2)
Prothrombin Time: 17.5 seconds — ABNORMAL HIGH (ref 11.4–15.2)

## 2022-12-12 LAB — LIPID PANEL
Cholesterol: 195 mg/dL (ref 0–200)
HDL: 36 mg/dL — ABNORMAL LOW (ref >40)
LDL Cholesterol: 133 mg/dL — ABNORMAL HIGH (ref 0–99)
Total CHOL/HDL Ratio: 5.4 RATIO
Triglycerides: 130 mg/dL (ref <150)
VLDL: 26 mg/dL (ref 0–40)

## 2022-12-12 LAB — HEMOGLOBIN A1C
Hgb A1c MFr Bld: 5.9 % — ABNORMAL HIGH (ref 4.8–5.6)
Mean Plasma Glucose: 122.63 mg/dL

## 2022-12-12 LAB — APTT
aPTT: 85 seconds — ABNORMAL HIGH (ref 24–36)
aPTT: 58 seconds — ABNORMAL HIGH (ref 24–36)

## 2022-12-12 LAB — MAGNESIUM: Magnesium: 2.5 mg/dL — ABNORMAL HIGH (ref 1.7–2.4)

## 2022-12-12 LAB — TROPONIN I (HIGH SENSITIVITY)
Troponin I (High Sensitivity): 173 ng/L (ref <18)
Troponin I (High Sensitivity): 11451 ng/L (ref <18)

## 2022-12-12 SURGERY — CORONARY/GRAFT ACUTE MI REVASCULARIZATION
Anesthesia: LOCAL

## 2022-12-12 MED ORDER — OXYCODONE HCL 5 MG PO TABS: 5.0000 mg | ORAL_TABLET | ORAL | Status: DC | PRN

## 2022-12-12 MED ORDER — TIROFIBAN HCL IN NACL 5-0.9 MG/100ML-% IV SOLN
INTRAVENOUS | Status: DC | PRN
  Administered 2022-12-12: .15 ug/kg/min via INTRAVENOUS

## 2022-12-12 MED ORDER — LIDOCAINE HCL (PF) 1 % IJ SOLN
INTRAMUSCULAR | Status: AC
  Filled 2022-12-12: qty 30

## 2022-12-12 MED ORDER — POTASSIUM CHLORIDE CRYS ER 20 MEQ PO TBCR
20.0000 meq | EXTENDED_RELEASE_TABLET | Freq: Two times a day (BID) | ORAL | Status: DC
  Administered 2022-12-12: 20 meq via ORAL
  Filled 2022-12-12 (×2): qty 1

## 2022-12-12 MED ORDER — AMIODARONE HCL 150 MG/3ML IV SOLN
INTRAVENOUS | Status: DC | PRN
  Administered 2022-12-12: 300 mg via INTRAVENOUS

## 2022-12-12 MED ORDER — ASPIRIN 81 MG PO CHEW
324.0000 mg | CHEWABLE_TABLET | Freq: Once | ORAL | Status: AC
  Administered 2022-12-12: 324 mg via ORAL

## 2022-12-12 MED ORDER — TICAGRELOR 90 MG PO TABS
ORAL_TABLET | ORAL | Status: DC | PRN
  Administered 2022-12-12: 180 mg via ORAL

## 2022-12-12 MED ORDER — SODIUM CHLORIDE 0.9% FLUSH: 3.0000 mL | INTRAVENOUS | Status: DC | PRN

## 2022-12-12 MED ORDER — IOHEXOL 350 MG/ML SOLN
INTRAVENOUS | Status: DC | PRN
  Administered 2022-12-12: 70 mL via INTRA_ARTERIAL

## 2022-12-12 MED ORDER — LIDOCAINE HCL (CARDIAC) PF 100 MG/5ML IV SOSY
PREFILLED_SYRINGE | INTRAVENOUS | Status: DC | PRN
  Administered 2022-12-12: 108 mg via INTRAVENOUS

## 2022-12-12 MED ORDER — SODIUM CHLORIDE 0.9 % IV SOLN: 250.0000 mL | INTRAVENOUS | Status: DC | PRN

## 2022-12-12 MED ORDER — LIDOCAINE HCL (PF) 1 % IJ SOLN
INTRAMUSCULAR | Status: DC | PRN
  Administered 2022-12-12: 2 mL
  Administered 2022-12-12: 14 mL

## 2022-12-12 MED ORDER — SODIUM CHLORIDE 0.9 % IV SOLN: INTRAVENOUS | Status: DC

## 2022-12-12 MED ORDER — NITROGLYCERIN 1 MG/10 ML FOR IR/CATH LAB
INTRA_ARTERIAL | Status: DC | PRN
  Administered 2022-12-12: 100 ug via INTRACORONARY

## 2022-12-12 MED ORDER — MORPHINE SULFATE (PF) 2 MG/ML IV SOLN
2.0000 mg | INTRAVENOUS | Status: DC | PRN
  Administered 2022-12-13: 2 mg via INTRAVENOUS
  Filled 2022-12-12: qty 1

## 2022-12-12 MED ORDER — HEPARIN (PORCINE) IN NACL 1000-0.9 UT/500ML-% IV SOLN
INTRAVENOUS | Status: DC | PRN
  Administered 2022-12-12: 500 mL

## 2022-12-12 MED ORDER — TICAGRELOR 90 MG PO TABS
ORAL_TABLET | ORAL | Status: AC
  Filled 2022-12-12: qty 2

## 2022-12-12 MED ORDER — ASPIRIN 81 MG PO TBEC
81.0000 mg | DELAYED_RELEASE_TABLET | Freq: Every day | ORAL | Status: DC
  Administered 2022-12-13 – 2022-12-15 (×3): 81 mg via ORAL
  Filled 2022-12-12 (×3): qty 1

## 2022-12-12 MED ORDER — CHLORHEXIDINE GLUCONATE CLOTH 2 % EX PADS
6.0000 | MEDICATED_PAD | Freq: Every day | CUTANEOUS | Status: DC
  Administered 2022-12-13 – 2022-12-15 (×3): 6 via TOPICAL

## 2022-12-12 MED ORDER — HEPARIN SODIUM (PORCINE) 1000 UNIT/ML IJ SOLN
INTRAMUSCULAR | Status: AC
  Filled 2022-12-12: qty 10

## 2022-12-12 MED ORDER — NITROGLYCERIN 0.4 MG SL SUBL: 0.4000 mg | SUBLINGUAL_TABLET | SUBLINGUAL | Status: DC | PRN

## 2022-12-12 MED ORDER — HEPARIN (PORCINE) IN NACL 1000-0.9 UT/500ML-% IV SOLN
INTRAVENOUS | Status: AC
  Filled 2022-12-12: qty 1000

## 2022-12-12 MED ORDER — HEPARIN SODIUM (PORCINE) 5000 UNIT/ML IJ SOLN: 60.0000 [IU]/kg | Freq: Once | INTRAMUSCULAR | Status: DC

## 2022-12-12 MED ORDER — VERAPAMIL HCL 2.5 MG/ML IV SOLN
INTRAVENOUS | Status: DC | PRN
  Administered 2022-12-12: 10 mL via INTRA_ARTERIAL

## 2022-12-12 MED ORDER — HEPARIN SODIUM (PORCINE) 5000 UNIT/ML IJ SOLN
4000.0000 [IU] | Freq: Once | INTRAMUSCULAR | Status: AC
  Administered 2022-12-12: 4000 [IU] via INTRAVENOUS

## 2022-12-12 MED ORDER — SODIUM CHLORIDE 0.9% FLUSH
3.0000 mL | Freq: Two times a day (BID) | INTRAVENOUS | Status: DC
  Administered 2022-12-12 – 2022-12-13 (×2): 3 mL via INTRAVENOUS

## 2022-12-12 MED ORDER — AMIODARONE HCL IN DEXTROSE 360-4.14 MG/200ML-% IV SOLN
30.0000 mg/h | INTRAVENOUS | Status: DC
  Administered 2022-12-13: 30 mg/h via INTRAVENOUS
  Filled 2022-12-12: qty 200

## 2022-12-12 MED ORDER — VERAPAMIL HCL 2.5 MG/ML IV SOLN
INTRAVENOUS | Status: AC
  Filled 2022-12-12: qty 2

## 2022-12-12 MED ORDER — FENTANYL CITRATE (PF) 100 MCG/2ML IJ SOLN
INTRAMUSCULAR | Status: AC
  Filled 2022-12-12: qty 2

## 2022-12-12 MED ORDER — TICAGRELOR 90 MG PO TABS
90.0000 mg | ORAL_TABLET | Freq: Two times a day (BID) | ORAL | Status: DC
  Administered 2022-12-13 – 2022-12-15 (×5): 90 mg via ORAL
  Filled 2022-12-12 (×5): qty 1

## 2022-12-12 MED ORDER — ORAL CARE MOUTH RINSE: 15.0000 mL | OROMUCOSAL | Status: DC | PRN

## 2022-12-12 MED ORDER — HYDRALAZINE HCL 20 MG/ML IJ SOLN: 10.0000 mg | INTRAMUSCULAR | Status: AC | PRN

## 2022-12-12 MED ORDER — POTASSIUM CHLORIDE 10 MEQ/100ML IV SOLN
10.0000 meq | INTRAVENOUS | Status: DC
  Administered 2022-12-12 (×2): 10 meq via INTRAVENOUS
  Filled 2022-12-12 (×3): qty 100

## 2022-12-12 MED ORDER — MIDAZOLAM HCL 2 MG/2ML IJ SOLN
INTRAMUSCULAR | Status: AC
  Filled 2022-12-12: qty 2

## 2022-12-12 MED ORDER — ACETAMINOPHEN 325 MG PO TABS
650.0000 mg | ORAL_TABLET | ORAL | Status: DC | PRN
  Administered 2022-12-12 – 2022-12-15 (×6): 650 mg via ORAL
  Filled 2022-12-12 (×6): qty 2

## 2022-12-12 MED ORDER — HEPARIN SODIUM (PORCINE) 1000 UNIT/ML IJ SOLN
INTRAMUSCULAR | Status: DC | PRN
  Administered 2022-12-12 (×2): 5000 [IU] via INTRAVENOUS

## 2022-12-12 MED ORDER — AMIODARONE HCL IN DEXTROSE 360-4.14 MG/200ML-% IV SOLN
60.0000 mg/h | INTRAVENOUS | Status: AC
  Administered 2022-12-12 (×2): 60 mg/h via INTRAVENOUS
  Filled 2022-12-12: qty 200

## 2022-12-12 MED ORDER — HEPARIN (PORCINE) 25000 UT/250ML-% IV SOLN
1150.0000 [IU]/h | INTRAVENOUS | Status: DC
  Administered 2022-12-12: 1000 [IU]/h via INTRAVENOUS
  Filled 2022-12-12: qty 250

## 2022-12-12 MED ORDER — SODIUM CHLORIDE 0.9 % IV SOLN: 250.0000 mL | INTRAVENOUS | Status: DC

## 2022-12-12 MED ORDER — LABETALOL HCL 5 MG/ML IV SOLN: 10.0000 mg | INTRAVENOUS | Status: AC | PRN

## 2022-12-12 MED ORDER — NOREPINEPHRINE 4 MG/250ML-% IV SOLN
2.0000 ug/min | INTRAVENOUS | Status: DC
  Administered 2022-12-12: 5 ug/min via INTRAVENOUS

## 2022-12-12 MED ORDER — LIDOCAINE HCL (CARDIAC) PF 100 MG/5ML IV SOSY
PREFILLED_SYRINGE | INTRAVENOUS | Status: AC
  Filled 2022-12-12: qty 5

## 2022-12-12 MED ORDER — POTASSIUM CHLORIDE CRYS ER 10 MEQ PO TBCR
20.0000 meq | EXTENDED_RELEASE_TABLET | Freq: Once | ORAL | Status: AC
  Administered 2022-12-12: 20 meq via ORAL
  Filled 2022-12-12: qty 2

## 2022-12-12 MED ORDER — FENTANYL CITRATE (PF) 100 MCG/2ML IJ SOLN
INTRAMUSCULAR | Status: DC | PRN
  Administered 2022-12-12: 25 ug via INTRAVENOUS

## 2022-12-12 MED ORDER — ONDANSETRON HCL 4 MG/2ML IJ SOLN: 4.0000 mg | Freq: Four times a day (QID) | INTRAMUSCULAR | Status: DC | PRN

## 2022-12-12 MED ORDER — TIROFIBAN (AGGRASTAT) BOLUS VIA INFUSION
INTRAVENOUS | Status: DC | PRN
  Administered 2022-12-12: 2700 ug via INTRAVENOUS

## 2022-12-12 MED ORDER — MIDAZOLAM HCL 2 MG/2ML IJ SOLN
INTRAMUSCULAR | Status: DC | PRN
  Administered 2022-12-12: 2 mg via INTRAVENOUS

## 2022-12-12 MED ORDER — ATORVASTATIN CALCIUM 80 MG PO TABS
80.0000 mg | ORAL_TABLET | Freq: Every day | ORAL | Status: DC
  Administered 2022-12-12 – 2022-12-15 (×4): 80 mg via ORAL
  Filled 2022-12-12 (×4): qty 1

## 2022-12-12 MED ORDER — NITROGLYCERIN 1 MG/10 ML FOR IR/CATH LAB
INTRA_ARTERIAL | Status: AC
  Filled 2022-12-12: qty 10

## 2022-12-12 MED ORDER — TIROFIBAN HCL IN NACL 5-0.9 MG/100ML-% IV SOLN
INTRAVENOUS | Status: AC
  Filled 2022-12-12: qty 100

## 2022-12-12 SURGICAL SUPPLY — 24 items
BALLN EMERGE MR 2.5X15 (BALLOONS) ×1
BALLN IABP SENSA PLUS 8F 50CC (BALLOONS) ×1
BALLOON EMERGE MR 2.5X15 (BALLOONS) IMPLANT
BALLOON IABP SENS PLUS 8F 50CC (BALLOONS) IMPLANT
CATH INFINITI JR4 5F (CATHETERS) IMPLANT
CATH LAUNCHER 6FR EBU3.5 (CATHETERS) IMPLANT
DEVICE RAD COMP TR BAND LRG (VASCULAR PRODUCTS) IMPLANT
ELECT DEFIB PAD ADLT CADENCE (PAD) IMPLANT
GLIDESHEATH SLEND SS 6F .021 (SHEATH) IMPLANT
GUIDEWIRE INQWIRE 1.5J.035X260 (WIRE) IMPLANT
INQWIRE 1.5J .035X260CM (WIRE) ×1
KIT ENCORE 26 ADVANTAGE (KITS) IMPLANT
KIT HEART LEFT (KITS) ×2 IMPLANT
KIT HEMO VALVE WATCHDOG (MISCELLANEOUS) IMPLANT
KIT MICROPUNCTURE NIT STIFF (SHEATH) IMPLANT
PACK CARDIAC CATHETERIZATION (CUSTOM PROCEDURE TRAY) ×2 IMPLANT
SHEATH PINNACLE 6F 10CM (SHEATH) IMPLANT
SHEATH PROBE COVER 6X72 (BAG) IMPLANT
STENT SYNERGY XD 3.50X24 (Permanent Stent) IMPLANT
SYNERGY XD 3.50X24 (Permanent Stent) ×1 IMPLANT
TRANSDUCER W/STOPCOCK (MISCELLANEOUS) ×2 IMPLANT
TUBING CIL FLEX 10 FLL-RA (TUBING) ×2 IMPLANT
WIRE COUGAR XT STRL 190CM (WIRE) IMPLANT
WIRE EMERALD 3MM-J .035X150CM (WIRE) IMPLANT

## 2022-12-12 NOTE — Progress Notes (Signed)
Orthopedic Tech Progress Note Patient Details:  Christopher [name] Sep 25, 1971 [id number]  Ortho Devices Type of Ortho Device: Knee Immobilizer Ortho Device/Splint Interventions: Ordered  I delivered to RN. They said they would apply it when needed.    Karolee Stamps 12/12/2022, 8:11 PM

## 2022-12-12 NOTE — ED Triage Notes (Signed)
Pt came through the front door and stated he does not feel well and started having agonal breathing.

## 2022-12-12 NOTE — ED Provider Notes (Signed)
[facility] Provider Note   CSN: [id number] Arrival date & time: 12/12/22  1524     History {Add pertinent medical, surgical, social history, OB history to HPI:1} Chief Complaint  Patient presents with   unresponsive/CPR   Code STEMI   Cardiac Arrest    Christopher [name] is a 52 y.o. male.   Cardiac Arrest      Home Medications Prior to Admission medications   Medication Sig Start Date End Date Taking? Authorizing Provider  albuterol (PROVENTIL HFA;VENTOLIN HFA) 108 (90 BASE) MCG/ACT inhaler Inhale 2 puffs into the lungs every 6 (six) hours as needed. For shortness of breath    [provider]  amLODipine (NORVASC) 5 MG tablet Take 5 mg by mouth every morning.  09/19/18   [provider]  cetirizine (ZYRTEC) 10 MG tablet Take 10 mg by mouth daily as needed.     [provider]  escitalopram (LEXAPRO) 20 MG tablet Take 20 mg by mouth daily.    [provider]  hydrOXYzine (ATARAX/VISTARIL) 25 MG tablet TAKE 1 TABLET BY MOUTH THREE TIMES DAILY AS NEEDED FOR ITCHING 11/14/18   [provider]  Multiple Vitamins-Minerals (MULTIVITAMIN ADULT PO) Take by mouth.    [provider]  mupirocin ointment (BACTROBAN) 2 % APPLY TOPICALLY TO THE ANTERIOR NOSTRILS TWO TIMES PER DAY FOR 7 DAY BEFORE SURGERY 11/10/18   [provider]  omeprazole (PRILOSEC) 20 MG capsule Take 20 mg by mouth as needed.    [provider]  traZODone (DESYREL) 50 MG tablet Take 50 mg by mouth at bedtime.    [provider]      Allergies    Patient has no known allergies.    Review of Systems   Review of Systems  Physical Exam Updated Vital Signs There were no vitals taken for this visit. Physical Exam  ED Results / Procedures / Treatments   Labs (all labs ordered are listed, but only abnormal results are displayed) Labs Reviewed  HEMOGLOBIN A1C  CBC WITH DIFFERENTIAL/PLATELET   PROTIME-INR  APTT  COMPREHENSIVE METABOLIC PANEL  LIPID PANEL  CBG MONITORING, ED  TROPONIN I (HIGH SENSITIVITY)    EKG EKG Interpretation  Date/Time:  Saturday December 12 2022 15:33:37 EST Ventricular Rate:  136 PR Interval:    QRS Duration: 93 QT Interval:  298 QTC Calculation: 449 R Axis:   -58 Text Interpretation: Atrial fibrillation Ventricular premature complex LAD, consider LAFB or inferior infarct Lateral infarct, acute (LAD) Borderline ST elevation, anterior leads Baseline wander in lead(s) II V4 V6 >>> Acute MI <<< Confirmed by Sherwood Gambler [phone]) on 12/12/2022 3:38:38 PM  Radiology No results found.  Procedures Procedures  {Document cardiac monitor, telemetry assessment procedure when appropriate:1}  Medications Ordered in ED Medications  0.9 %  sodium chloride infusion (has no administration in time range)  0.9 %  sodium chloride infusion (has no administration in time range)  norepinephrine (LEVOPHED) 54m in 2557m(0.016 mg/mL) premix infusion (5 mcg/min Intravenous New Bag/Given 12/12/22 1550)  amiodarone (NEXTERONE PREMIX) 360-4.14 MG/200ML-% (1.8 mg/mL) IV infusion (60 mg/hr Intravenous New Bag/Given 12/12/22 1553)  amiodarone (NEXTERONE PREMIX) 360-4.14 MG/200ML-% (1.8 mg/mL) IV infusion (has no administration in time range)  aspirin chewable tablet 324 mg (324 mg Oral Given 12/12/22 1549)  heparin injection 4,000 Units (4,000 Units Intravenous Given 12/12/22 1542)    ED Course/ Medical Decision Making/ A&P   {   Click here for ABCD2, HEART and  other calculatorsREFRESH Note before signing :1}                          Medical Decision Making Christopher [name] 52 yo M PMH HTN, depression presented to the emergency department as a witnessed cardiac arrest after arriving to the hosp POV after sudden onset chest pain.   CPR started immediately, pads placed, initial rhythm vfib on monitor. 200J shock delivered with ROSC obtained. Given vfib, 365m IV amio  administered. Initial BP systolic 90000000 Patient awake and alert GCS 13 after defibrillation. EKG obtained showing poor baseline but concerning findings for STEMI with ST elevations in V2 and V3 with ST depressions in the inferior leads.  We were attempting to gain a second IV access and obtain a cuff pressure when patient again fell into V. tach patient's mental status declining pulse became thready therefore we opted to again defibrillated the patient at 200 J patient immediately awoke and was alert and oriented x 4 with a GCS of 15 was in a normal sinus rhythm EKG immediately showedST segment elevations in V1 V2 V3 with ST segment elevations in lead I and depressions in 2 3 aVF concerning for STEMI.  Code STEMI was called cardiology was consulted and agreed to meet the patient at bedside.  Given his V. tach and V-fib and amiodarone drip was initiated patient was also given bolus dose heparin as well as heparin drip along with chewable aspirin.   Amount and/or Complexity of Data Reviewed Labs: ordered. Radiology: ordered.  Risk OTC drugs. Prescription drug management.   ***  {Document critical care time when appropriate:1} {Document review of labs and clinical decision tools ie heart score, Chads2Vasc2 etc:1}  {Document your independent review of radiology images, and any outside records:1} {Document your discussion with family members, caretakers, and with consultants:1} {Document social determinants of health affecting pt's care:1} {Document your decision making why or why not admission, treatments were needed:1} Final Clinical Impression(s) / ED Diagnoses Final diagnoses:  STEMI (ST elevation myocardial infarction) ([facility]  ST elevation myocardial infarction (STEMI), unspecified artery ([facility]    Rx / DC Orders ED Discharge Orders     None

## 2022-12-12 NOTE — ED Provider Notes (Signed)
Patient presents in shock and had a brief arrest in the ED. ill-appearing the moment he was wheeled into the ED room and was quickly found to be in V-fib.  CPR had been started and he was defibrillated.  Seem to wake up after this though was obtunded.  Ultimately went into A-fib with RVR with clear STEMI.  Still not doing the greatest and was put on Levophed, given amiodarone bolus, heparin, aspirin, etc.  Started becoming more obtunded and ultimately we were planning to intubate but then he went into ventricular tachycardia and given his instability he was defibrillated again.  This time he woke up and has had normal mental status ever since.  Heart rate normalized and he has a clear STEMI.  Discussed with Dr. Burt Knack who is taking him emergently to the Cath Lab.  CRITICAL CARE Performed by: Ephraim Hamburger   Total critical care time: 40 minutes  Critical care time was exclusive of separately billable procedures and treating other patients.  Critical care was necessary to treat or prevent imminent or life-threatening deterioration.  Critical care was time spent personally by me on the following activities: development of treatment plan with patient and/or surrogate as well as nursing, discussions with consultants, evaluation of patient's response to treatment, examination of patient, obtaining history from patient or surrogate, ordering and performing treatments and interventions, ordering and review of laboratory studies, ordering and review of radiographic studies, pulse oximetry and re-evaluation of patient's condition.  Cardiopulmonary Resuscitation (CPR) Procedure Note Directed/Performed by: Ephraim Hamburger I personally directed ancillary staff and/or performed CPR in an effort to regain return of spontaneous circulation and to maintain cardiac, neuro and systemic perfusion.      Sherwood Gambler, MD 12/12/22 [phone]
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
## 2022-12-12 NOTE — ED Notes (Signed)
1529- CPR started, pt in vfib 1530- Pt shocked at [place] regained pulses  1533- 300 of amnio given  1535- STEMI activated  1542- Pt back in vfib and pt shocked at [place] is axox4

## 2022-12-13 ENCOUNTER — Inpatient Hospital Stay: Payer: Self-pay

## 2022-12-13 ENCOUNTER — Other Ambulatory Visit: Payer: Self-pay

## 2022-12-13 ENCOUNTER — Inpatient Hospital Stay (HOSPITAL_COMMUNITY): Payer: BC Managed Care – PPO

## 2022-12-13 ENCOUNTER — Other Ambulatory Visit (HOSPITAL_COMMUNITY): Payer: Self-pay

## 2022-12-13 DIAGNOSIS — I5021 Acute systolic (congestive) heart failure: Secondary | ICD-10-CM

## 2022-12-13 DIAGNOSIS — I2102 ST elevation (STEMI) myocardial infarction involving left anterior descending coronary artery: Secondary | ICD-10-CM | POA: Diagnosis not present

## 2022-12-13 DIAGNOSIS — I4891 Unspecified atrial fibrillation: Secondary | ICD-10-CM

## 2022-12-13 DIAGNOSIS — R570 Cardiogenic shock: Secondary | ICD-10-CM

## 2022-12-13 LAB — CBC
HCT: 45.8 % (ref 39.0–52.0)
Hemoglobin: 15.4 g/dL (ref 13.0–17.0)
MCH: 29.4 pg (ref 26.0–34.0)
MCHC: 33.6 g/dL (ref 30.0–36.0)
MCV: 87.4 fL (ref 80.0–100.0)
Platelets: 102 10*3/uL — ABNORMAL LOW (ref 150–400)
RBC: 5.24 MIL/uL (ref 4.22–5.81)
RDW: 12.8 % (ref 11.5–15.5)
WBC: 9.4 10*3/uL (ref 4.0–10.5)
nRBC: 0 % (ref 0.0–0.2)

## 2022-12-13 LAB — ECHOCARDIOGRAM COMPLETE
Area-P 1/2: 3.21 cm2
Height: 70 in
S' Lateral: 4.1 cm
Weight: 3908.31 oz

## 2022-12-13 LAB — BASIC METABOLIC PANEL
Anion gap: 10 (ref 5–15)
BUN: 13 mg/dL (ref 6–20)
CO2: 22 mmol/L (ref 22–32)
Calcium: 8.8 mg/dL — ABNORMAL LOW (ref 8.9–10.3)
Chloride: 104 mmol/L (ref 98–111)
Creatinine, Ser: 1.02 mg/dL (ref 0.61–1.24)
GFR, Estimated: >60 mL/min (ref >60)
Glucose, Bld: 168 mg/dL — ABNORMAL HIGH (ref 70–99)
Potassium: 3.8 mmol/L (ref 3.5–5.1)
Sodium: 136 mmol/L (ref 135–145)

## 2022-12-13 LAB — COOXEMETRY PANEL
Carboxyhemoglobin: 2.2 % — ABNORMAL HIGH (ref 0.5–1.5)
Methemoglobin: <0.7 % (ref 0.0–1.5)
O2 Saturation: 70.6 %
Total hemoglobin: 16.5 g/dL — ABNORMAL HIGH (ref 12.0–16.0)

## 2022-12-13 LAB — POCT ACTIVATED CLOTTING TIME
Activated Clotting Time: 206 seconds
Activated Clotting Time: 363 seconds
Activated Clotting Time: 136 seconds

## 2022-12-13 LAB — HEPARIN LEVEL (UNFRACTIONATED): Heparin Unfractionated: 0.11 IU/mL — ABNORMAL LOW (ref 0.30–0.70)

## 2022-12-13 LAB — HIV ANTIBODY (ROUTINE TESTING W REFLEX): HIV Screen 4th Generation wRfx: NONREACTIVE

## 2022-12-13 MED ORDER — PERFLUTREN LIPID MICROSPHERE
1.0000 mL | INTRAVENOUS | Status: AC | PRN
  Administered 2022-12-13: 5 mL via INTRAVENOUS

## 2022-12-13 MED ORDER — AMIODARONE HCL 200 MG PO TABS: 200.0000 mg | ORAL_TABLET | Freq: Two times a day (BID) | ORAL | Status: DC

## 2022-12-13 MED ORDER — THIAMINE HCL 100 MG/ML IJ SOLN: 100.0000 mg | Freq: Every day | INTRAMUSCULAR | Status: DC

## 2022-12-13 MED ORDER — LORAZEPAM 2 MG/ML IJ SOLN: 1.0000 mg | INTRAMUSCULAR | Status: DC | PRN

## 2022-12-13 MED ORDER — FOLIC ACID 1 MG PO TABS
1.0000 mg | ORAL_TABLET | Freq: Every day | ORAL | Status: DC
  Administered 2022-12-13 – 2022-12-15 (×3): 1 mg via ORAL
  Filled 2022-12-13 (×3): qty 1

## 2022-12-13 MED ORDER — ADULT MULTIVITAMIN W/MINERALS CH
1.0000 | ORAL_TABLET | Freq: Every day | ORAL | Status: DC
  Administered 2022-12-13 – 2022-12-15 (×3): 1 via ORAL
  Filled 2022-12-13 (×3): qty 1

## 2022-12-13 MED ORDER — SODIUM CHLORIDE 0.9% FLUSH: 10.0000 mL | INTRAVENOUS | Status: DC | PRN

## 2022-12-13 MED ORDER — POTASSIUM CHLORIDE CRYS ER 20 MEQ PO TBCR
40.0000 meq | EXTENDED_RELEASE_TABLET | Freq: Once | ORAL | Status: AC
  Administered 2022-12-13: 40 meq via ORAL
  Filled 2022-12-13: qty 2

## 2022-12-13 MED ORDER — SPIRONOLACTONE 12.5 MG HALF TABLET
12.5000 mg | ORAL_TABLET | Freq: Every day | ORAL | Status: DC
  Administered 2022-12-13 – 2022-12-14 (×2): 12.5 mg via ORAL
  Filled 2022-12-13 (×2): qty 1

## 2022-12-13 MED ORDER — THIAMINE MONONITRATE 100 MG PO TABS
100.0000 mg | ORAL_TABLET | Freq: Every day | ORAL | Status: DC
  Administered 2022-12-13 – 2022-12-15 (×3): 100 mg via ORAL
  Filled 2022-12-13 (×3): qty 1

## 2022-12-13 MED ORDER — SODIUM CHLORIDE 0.9% FLUSH
10.0000 mL | Freq: Two times a day (BID) | INTRAVENOUS | Status: DC
  Administered 2022-12-13 – 2022-12-14 (×4): 10 mL

## 2022-12-13 MED ORDER — LORAZEPAM 0.5 MG PO TABS: 1.0000 mg | ORAL_TABLET | ORAL | Status: DC | PRN

## 2022-12-13 MED ORDER — FUROSEMIDE 10 MG/ML IJ SOLN
40.0000 mg | Freq: Once | INTRAMUSCULAR | Status: AC
  Administered 2022-12-13: 40 mg via INTRAVENOUS
  Filled 2022-12-13: qty 4

## 2022-12-13 MED ORDER — LOSARTAN POTASSIUM 25 MG PO TABS
12.5000 mg | ORAL_TABLET | Freq: Every day | ORAL | Status: DC
  Administered 2022-12-13: 12.5 mg via ORAL
  Filled 2022-12-13: qty 1
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
## 2022-12-13 NOTE — Progress Notes (Signed)
Called in to removed IABP. ACT 136. Right DP pulse palpable prior to removal. IABP and sheath removed intact. Manual pressure applied to site x 30 min. No bleeding or hematoma noted. Right Dp pulse is palpable post IABP removal. VS remained stable throughout removal and post removal. 4x4 tegaderm dressing applied to site. Post activity and precautions explained to patient and family; verbalized understanding. Nurse to bedside; care released.Cindee Salt

## 2022-12-13 NOTE — Progress Notes (Signed)
Progress Note  Patient Name: Christopher [name] Date of Encounter: 12/13/2022  Primary Cardiologist: None     Patient Profile     52 y.o. male admitted Anterior wall MI initial presentation with atrial fibrillation.  Course and PCI complicated by ventricular fibrillation, recurrent  nonsustained polymorphic ventricular tachycardia shock requiring IABP and Levophed now off occurring in the setting of recent cocaine (by history) Subjective   Feeling much better.  No chest pain.  No shortness of breath  Inpatient Medications    Scheduled Meds:  aspirin EC  81 mg Oral Daily   atorvastatin  80 mg Oral Daily   Chlorhexidine Gluconate Cloth  6 each Topical Daily   potassium chloride  20 mEq Oral BID   sodium chloride flush  3 mL Intravenous Q12H   ticagrelor  90 mg Oral BID   Continuous Infusions:  sodium chloride 10 mL/hr at 12/13/22 0700   sodium chloride     sodium chloride     amiodarone 30 mg/hr (12/13/22 0700)   heparin 1,150 Units/hr (12/13/22 0700)   norepinephrine (LEVOPHED) Adult infusion Stopped (12/12/22 1900)   PRN Meds: sodium chloride, acetaminophen, morphine injection, nitroGLYCERIN, ondansetron (ZOFRAN) IV, mouth rinse, oxyCODONE, sodium chloride flush   Vital Signs    Vitals:   12/13/22 0400 12/13/22 0500 12/13/22 0600 12/13/22 0700  BP: 127/82 136/79 119/76 (!) 149/80  Pulse:  (!) 203    Resp: 17 17 (!) 21 (!) 23  Temp: 97.7 F (36.5 C)     TempSrc: Oral     SpO2: 97% 97% 96% 96%  Weight:      Height:        Intake/Output Summary (Last 24 hours) at 12/13/2022 0752 Last data filed at 12/13/2022 0700 Gross per 24 hour  Intake 883.5 ml  Output --  Net 883.5 ml   Filed Weights   12/12/22 1930  Weight: 110.8 kg    Telemetry    Sinus with occasional nonsustained VT- Personally Reviewed Telemetry from the ER nonsustained VT and VF  ECG    Initial ECG atrial fibrillation with ST elevation  Physical Exam    GEN: No acute distress.   Neck:  No JVD Cardiac: RRR, no murmurs, rubs, or gallops.  Respiratory: Clear to auscultation bilaterally. GI: Soft, nontender, non-distended  MS: No edema; No deformity. Neuro:  Nonfocal  Psych: Normal affect   Labs    Chemistry Recent Labs  Lab 12/12/22 1538 12/12/22 2013 12/13/22 0406  NA 140 138 136  K 2.8* 3.7 3.8  CL 107 104 104  CO2 18* 21* 22  GLUCOSE 128* 164* 168*  BUN 8 9 13  $ CREATININE 1.40* 1.16 1.02  CALCIUM 8.0* 8.9 8.8*  PROT 6.2*  --   --   ALBUMIN 3.5  --   --   AST 50*  --   --   ALT 62*  --   --   ALKPHOS 63  --   --   BILITOT 0.9  --   --   GFRNONAA >60 >60 >60  ANIONGAP 15 13 10     $ Hematology Recent Labs  Lab 12/12/22 1538 12/13/22 0406  WBC 12.4* 9.4  RBC 5.50 5.24  HGB 16.3 15.4  HCT 49.2 45.8  MCV 89.5 87.4  MCH 29.6 29.4  MCHC 33.1 33.6  RDW 12.5 12.8  PLT 147* 102*    Cardiac EnzymesNo results for input(s): "TROPONINI" in the last 168 hours. No results for input(s): "TROPIPOC" in the last  168 hours.   BNPNo results for input(s): "BNP", "PROBNP" in the last 168 hours.   DDimer No results for input(s): "DDIMER" in the last 168 hours.   Radiology    DG Chest Port 1 View  Result Date: 12/12/2022 CLINICAL DATA:  Intra-aortic balloon pump EXAM: PORTABLE CHEST 1 VIEW COMPARISON:  12/12/2022 at 1559 hours FINDINGS: There is a intra-aortic balloon pump marker which projects over the aortic arch. The heart size and mediastinal contours are within normal limits. Both lungs are clear. No pneumothorax. The visualized skeletal structures are unremarkable. IMPRESSION: High positioning of intra-aortic balloon pump marker at the level of the aortic arch. Recommend 2-3 cm of advancement. These results will be called to the ordering clinician or representative by the Radiologist Assistant, and communication documented in the PACS or [employer]. Electronically Signed   By: Davina Poke D.O.   On: 12/12/2022 19:01   CARDIAC  CATHETERIZATION  Result Date: 12/12/2022 1.  Acute anterior STEMI complicated by ventricular fibrillation and subsequent ventricular tachycardia 2.  Total occlusion of the proximal LAD, treated successfully with primary PCI using a 3.5 x 24 mm Synergy DES 3.  Mild nonobstructive plaquing in the left main, left circumflex, and mid RCA.  Significant stenosis in the distal circumflex, but this is a small caliber vessel appropriate for medical therapy. 4.  Acute systolic heart failure with severely elevated LVEDP.  Intra-aortic balloon pump counterpulsation initiated prior to reperfusion therapy with PCI 5.  Cardiogenic shock requiring low-dose norepinephrine infusion.  Improvement in LVEDP demonstrated by repeat LVEDP measurement post PCI and post IABP placement (LVEDP 35 mmHg initially, 26 mmHg at procedure completion). Recommendations: Aggrastat turned off and Cath Lab Aspirin and ticagrelor without interruption x 12 months Aggressive medical therapy for residual CAD 2D echo to assess degree of LV dysfunction Continue intra-aortic balloon pump 1: 1 augmentation, possible early wean pending further clinical improvement Wean off norepinephrine as tolerated Diurese as tolerated   DG Chest Port 1 View  Result Date: 12/12/2022 CLINICAL DATA:  Cardiac arrest EXAM: PORTABLE CHEST 1 VIEW COMPARISON:  Chest x-ray June 18, 2020 FINDINGS: The cardiomediastinal silhouette is unchanged in contour. No focal pulmonary opacity. No pleural effusion or pneumothorax. The visualized upper abdomen is unremarkable. No acute osseous abnormality. IMPRESSION: No acute cardiopulmonary abnormality. Electronically Signed   By: Beryle Flock M.D.   On: 12/12/2022 16:19    Cardiac Studies   Echo pending Assessment & Plan    STEMI-- anterolateral  Shock-Levophed //lABP support   VF and VT>> lido and amio  Bipolar disorder  Cocaine use //EtOH abuse  HTN  Amiodarone  TIA  Atrial fib   Have asked CHF to assist  with IABP On heparin, for IABP, but will need anticoagulation for Afib with prior TIA With large anterior wall MI, ( suspect depressed LVEF) will prob need a LIFE VEST VT VF or ischemic.  Will discontinue amiodarone Denies prior TIA; with anticipated coagulation until we make a decision regarding long-term device implantation and then consider monitoring with a loop recorder.   For questions or updates, please contact [facility] Please consult [email or web address] for contact info under Cardiology/STEMI.      Signed, Virl Axe, MD  12/13/2022, 7:52 AM

## 2022-12-13 NOTE — Plan of Care (Signed)
  Problem: Clinical Measurements: Goal: Ability to maintain clinical measurements within normal limits will improve Outcome: Progressing Goal: Will remain free from infection Outcome: Progressing Goal: Diagnostic test results will improve Outcome: Progressing Goal: Respiratory complications will improve Outcome: Progressing Goal: Cardiovascular complication will be avoided Outcome: Progressing   Problem: Activity: Goal: Risk for activity intolerance will decrease Outcome: Progressing   Problem: Nutrition: Goal: Adequate nutrition will be maintained Outcome: Progressing   Problem: Coping: Goal: Level of anxiety will decrease Outcome: Progressing   Problem: Pain Managment: Goal: General experience of comfort will improve Outcome: Progressing   Problem: Safety: Goal: Ability to remain free from injury will improve Outcome: Progressing   Problem: Skin Integrity: Goal: Risk for impaired skin integrity will decrease Outcome: Progressing   Problem: Cardiac: Goal: Ability to achieve and maintain adequate cardiopulmonary perfusion will improve Outcome: Progressing Goal: Vascular access site(s) Level 0-1 will be maintained Outcome: Progressing   Problem: Fluid Volume: Goal: Ability to achieve a balanced intake and output will improve Outcome: Progressing   Problem: Physical Regulation: Goal: Complications related to the disease process, condition or treatment will be avoided or minimized Outcome: Progressing   Problem: Respiratory: Goal: Will regain and/or maintain adequate ventilation Outcome: Progressing   Problem: Activity: Goal: Ability to tolerate increased activity will improve Outcome: Progressing   Problem: Cardiac: Goal: Ability to achieve and maintain adequate cardiopulmonary perfusion will improve Outcome: Progressing Goal: Vascular access site(s) Level 0-1 will be maintained Outcome: Progressing

## 2022-12-13 NOTE — Progress Notes (Signed)
Orthopedic Tech Progress Note Patient Details:  Christopher [name] 1971-07-14 [id number]  Patient ID: Christopher [name], male   DOB: 1971-04-13, 52 y.o.   MRN: [id number] The Rn called for me to pick up the knee immobilizer as they did not use it. Karolee Stamps 12/13/2022, 8:37 PM

## 2022-12-13 NOTE — Progress Notes (Signed)
  Echocardiogram 2D Echocardiogram has been performed.  Christopher [name] 12/13/2022, 2:33 PM

## 2022-12-13 NOTE — Progress Notes (Signed)
Peripherally Inserted Central Catheter Placement  The IV Nurse has discussed with the patient and/or persons authorized to consent for the patient, the purpose of this procedure and the potential benefits and risks involved with this procedure.  The benefits include less needle sticks, lab draws from the catheter, and the patient may be discharged home with the catheter. Risks include, but not limited to, infection, bleeding, blood clot (thrombus formation), and puncture of an artery; nerve damage and irregular heartbeat and possibility to perform a PICC exchange if needed/ordered by physician.  Alternatives to this procedure were also discussed.  Bard Power PICC patient education guide, fact sheet on infection prevention and patient information card has been provided to patient /or left at bedside.    PICC Placement Documentation  PICC Double Lumen 123XX123 Right Basilic 40 cm 1 cm (Active)  Indication for Insertion or Continuance of Line Vasoactive infusions;Chronic illness with exacerbations (CF, Sickle Cell, etc.) 12/13/22 1200  Exposed Catheter (cm) 1 cm 12/13/22 1200  Site Assessment Clean, Dry, Intact 12/13/22 1200  Lumen #1 Status Flushed;Saline locked;Blood return noted 12/13/22 1200  Lumen #2 Status Flushed;Saline locked;Blood return noted 12/13/22 1200  Dressing Type Transparent;Securing device 12/13/22 1200  Dressing Status Antimicrobial disc in place;Clean, Dry, Intact 12/13/22 1200  Safety Lock Not Applicable [id number]  Line Care Connections checked and tightened 12/13/22 1200  Line Adjustment (NICU/IV Team Only) No 12/13/22 1200  Dressing Intervention New dressing 12/13/22 1200  Dressing Change Due 12/20/22 12/13/22 1200       Rolena Infante 12/13/2022, 12:01 PM
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
## 2022-12-14 ENCOUNTER — Encounter (HOSPITAL_COMMUNITY): Payer: Self-pay | Admitting: Cardiovascular Disease

## 2022-12-14 ENCOUNTER — Other Ambulatory Visit (HOSPITAL_COMMUNITY): Payer: Self-pay

## 2022-12-14 DIAGNOSIS — I2102 ST elevation (STEMI) myocardial infarction involving left anterior descending coronary artery: Secondary | ICD-10-CM | POA: Diagnosis not present

## 2022-12-14 LAB — POCT I-STAT, CHEM 8
BUN: 9 mg/dL (ref 6–20)
Calcium, Ion: 0.83 mmol/L — CL (ref 1.15–1.40)
Chloride: 110 mmol/L (ref 98–111)
Creatinine, Ser: 1.4 mg/dL — ABNORMAL HIGH (ref 0.61–1.24)
Glucose, Bld: 124 mg/dL — ABNORMAL HIGH (ref 70–99)
HCT: 44 % (ref 39.0–52.0)
Hemoglobin: 15 g/dL (ref 13.0–17.0)
Potassium: 2.9 mmol/L — ABNORMAL LOW (ref 3.5–5.1)
Sodium: 140 mmol/L (ref 135–145)
TCO2: 16 mmol/L — ABNORMAL LOW (ref 22–32)

## 2022-12-14 LAB — BASIC METABOLIC PANEL
Anion gap: 9 (ref 5–15)
BUN: 9 mg/dL (ref 6–20)
CO2: 26 mmol/L (ref 22–32)
Calcium: 8.9 mg/dL (ref 8.9–10.3)
Chloride: 103 mmol/L (ref 98–111)
Creatinine, Ser: 0.96 mg/dL (ref 0.61–1.24)
GFR, Estimated: >60 mL/min (ref >60)
Glucose, Bld: 155 mg/dL — ABNORMAL HIGH (ref 70–99)
Potassium: 4 mmol/L (ref 3.5–5.1)
Sodium: 138 mmol/L (ref 135–145)

## 2022-12-14 LAB — CBC
HCT: 47.7 % (ref 39.0–52.0)
Hemoglobin: 16.4 g/dL (ref 13.0–17.0)
MCH: 29.9 pg (ref 26.0–34.0)
MCHC: 34.4 g/dL (ref 30.0–36.0)
MCV: 86.9 fL (ref 80.0–100.0)
Platelets: 99 10*3/uL — ABNORMAL LOW (ref 150–400)
RBC: 5.49 MIL/uL (ref 4.22–5.81)
RDW: 12.7 % (ref 11.5–15.5)
WBC: 8.2 10*3/uL (ref 4.0–10.5)
nRBC: 0 % (ref 0.0–0.2)

## 2022-12-14 LAB — COOXEMETRY PANEL
Carboxyhemoglobin: 3.7 % — ABNORMAL HIGH (ref 0.5–1.5)
Methemoglobin: <0.7 % (ref 0.0–1.5)
O2 Saturation: 79.1 %
Total hemoglobin: 16 g/dL (ref 12.0–16.0)

## 2022-12-14 MED ORDER — LOSARTAN POTASSIUM 25 MG PO TABS
25.0000 mg | ORAL_TABLET | Freq: Every day | ORAL | Status: DC
  Administered 2022-12-14 – 2022-12-15 (×2): 25 mg via ORAL
  Filled 2022-12-14 (×2): qty 1

## 2022-12-14 MED ORDER — VENLAFAXINE HCL ER 75 MG PO CP24
75.0000 mg | ORAL_CAPSULE | Freq: Every day | ORAL | Status: DC
  Administered 2022-12-14 – 2022-12-15 (×2): 75 mg via ORAL
  Filled 2022-12-14 (×2): qty 1

## 2022-12-14 MED ORDER — TRAZODONE HCL 50 MG PO TABS
50.0000 mg | ORAL_TABLET | Freq: Every day | ORAL | Status: DC
  Administered 2022-12-14: 50 mg via ORAL
  Filled 2022-12-14: qty 1

## 2022-12-14 MED ORDER — DIVALPROEX SODIUM 250 MG PO DR TAB
1000.0000 mg | DELAYED_RELEASE_TABLET | Freq: Every day | ORAL | Status: DC
  Administered 2022-12-14: 1000 mg via ORAL
  Filled 2022-12-14: qty 4
  Filled 2022-12-14: qty 2

## 2022-12-14 MED ORDER — ARIPIPRAZOLE 5 MG PO TABS
10.0000 mg | ORAL_TABLET | Freq: Every day | ORAL | Status: DC
  Administered 2022-12-14 – 2022-12-15 (×2): 10 mg via ORAL
  Filled 2022-12-14: qty 2
  Filled 2022-12-14: qty 1

## 2022-12-14 MED ORDER — QUETIAPINE FUMARATE 25 MG PO TABS
25.0000 mg | ORAL_TABLET | Freq: Every day | ORAL | Status: DC
  Filled 2022-12-14: qty 1

## 2022-12-14 MED ORDER — DAPAGLIFLOZIN PROPANEDIOL 10 MG PO TABS
10.0000 mg | ORAL_TABLET | Freq: Every day | ORAL | Status: DC
  Administered 2022-12-14 – 2022-12-15 (×2): 10 mg via ORAL
  Filled 2022-12-14 (×2): qty 1

## 2022-12-14 MED ORDER — CARVEDILOL 3.125 MG PO TABS
3.1250 mg | ORAL_TABLET | Freq: Two times a day (BID) | ORAL | Status: DC
  Administered 2022-12-14 – 2022-12-15 (×3): 3.125 mg via ORAL
  Filled 2022-12-14 (×3): qty 1

## 2022-12-14 NOTE — TOC Benefit Eligibility Note (Signed)
Patient Teacher, English as a foreign language completed.    The patient is currently admitted and upon discharge could be taking Brilinta 90 mg.  The current 30 day co-pay is $0.00.   The patient is currently admitted and upon discharge could be taking Farxiga 10 mg.  The current 30 day co-pay is $0.00.   The patient is currently admitted and upon discharge could be taking Jardiance 10 mg.  The current 30 day co-pay is $0.00.   The patient is insured through [employer], [place] Patient [place] Patient Advocate Team Direct Number: [phone]  Fax: [phone]

## 2022-12-14 NOTE — Progress Notes (Signed)
Advanced Heart Failure Rounding Note  PCP-Cardiologist: None    Patient Profile   52 y.o. with history of bipolar disorder, ETOH abuse, HTN, ?TIA was admitted with anterolateral STEMI, C/b VF>> shocked x 2 and had CPR with rapid ROSC. Initial EKG atrial fibrillation with ST elevation.  Cath w/ occluded LAD and small LCx with 70% distal stenosis s/p LAD PCI. Required norepinephrine during case. IABP placed. Patient reported recent cocaine use.    Subjective:    Echo EF 50-55%, trivial MR, normal RV  IABP removed yesterday. No current pressor requirements. SBPs 120s-140s. Renal fx ok, SCr 0.96  Co-ox 79%. CVP 3-4   Feels good. No further CP.  Rt femoral IABP access site stable. Ambulating w/o difficulty.  No Afib on tele. + 4 beat run NSVT at midnight.    Objective:   Weight Range: 107 kg Body mass index is 33.85 kg/m.   Vital Signs:   Temp:  [97.7 F (36.5 C)-98.3 F (36.8 C)] 97.9 F (36.6 C) (02/12 0400) Pulse Rate:  [69-209] 87 (02/12 0600) Resp:  [8-26] 17 (02/12 0600) BP: (106-159)/(72-110) 126/89 (02/12 0600) SpO2:  [93 %-98 %] 95 % (02/12 0600) Arterial Line BP: (111-157)/(60-89) 111/60 (02/11 1500) Weight:  [107 kg] 107 kg (02/12 0600) Last BM Date : 12/11/22  Weight change: Filed Weights   12/12/22 1930 12/14/22 0600  Weight: 110.8 kg 107 kg    Intake/Output:   Intake/Output Summary (Last 24 hours) at 12/14/2022 0715 Last data filed at 12/14/2022 0600 Gross per 24 hour  Intake 1499.81 ml  Output 1850 ml  Net -350.19 ml      Physical Exam    CVP 4  General:  Well appearing. No resp difficulty HEENT: Normal Neck: Supple. JVP not elevated . Carotids 2+ bilat; no bruits. No lymphadenopathy or thyromegaly appreciated. Cor: PMI nondisplaced. Regular rate & rhythm. No rubs, gallops or murmurs. Lungs: Clear Abdomen: Soft, nontender, nondistended. No hepatosplenomegaly. No bruits or masses. Good bowel sounds. Extremities: No cyanosis, clubbing,  rash, edema Neuro: Alert & orientedx3, cranial nerves grossly intact. moves all 4 extremities w/o difficulty. Affect pleasant   Telemetry   NSR. 4 beat run NSVT x 1. No Afib. Personally reviewed   EKG    NSR 88 bpm   Labs    CBC Recent Labs    12/12/22 1538 12/13/22 0406 12/14/22 0607  WBC 12.4* 9.4 8.2  NEUTROABS 5.1  --   --   HGB 16.3 15.4 16.4  HCT 49.2 45.8 47.7  MCV 89.5 87.4 86.9  PLT 147* 102* 99*   Basic Metabolic Panel Recent Labs    12/12/22 2013 12/13/22 0406 12/14/22 0607  NA 138 136 138  K 3.7 3.8 4.0  CL 104 104 103  CO2 21* 22 26  GLUCOSE 164* 168* 155*  BUN 9 13 9  $ CREATININE 1.16 1.02 0.96  CALCIUM 8.9 8.8* 8.9  MG 2.5*  --   --    Liver Function Tests Recent Labs    12/12/22 1538  AST 50*  ALT 62*  ALKPHOS 63  BILITOT 0.9  PROT 6.2*  ALBUMIN 3.5   No results for input(s): "LIPASE", "AMYLASE" in the last 72 hours. Cardiac Enzymes No results for input(s): "CKTOTAL", "CKMB", "CKMBINDEX", "TROPONINI" in the last 72 hours.  BNP: BNP (last 3 results) No results for input(s): "BNP" in the last 8760 hours.  ProBNP (last 3 results) No results for input(s): "PROBNP" in the last 8760 hours.   D-Dimer No  results for input(s): "DDIMER" in the last 72 hours. Hemoglobin A1C Recent Labs    12/12/22 1538  HGBA1C 5.9*   Fasting Lipid Panel Recent Labs    12/12/22 1538  CHOL 195  HDL 36*  LDLCALC 133*  TRIG 130  CHOLHDL 5.4   Thyroid Function Tests No results for input(s): "TSH", "T4TOTAL", "T3FREE", "THYROIDAB" in the last 72 hours.  Invalid input(s): "FREET3"  Other results:   Imaging    ECHOCARDIOGRAM COMPLETE  Result Date: 12/13/2022    ECHOCARDIOGRAM REPORT   Patient Name:   Christopher ROBBERSON Date of Exam: 12/13/2022 Medical Rec #:  [id number]        Height:       70.0 in Accession #:    [id number]       Weight:       244.3 lb Date of Birth:  1971/04/25        BSA:          2.272 m Patient Age:    70 years         BP:            106/92 mmHg Patient Gender: M                HR:           78 bpm. Exam Location:  Inpatient Procedure: 2D Echo, Cardiac Doppler, Color Doppler and Intracardiac            Opacification Agent Indications:    acute systolic chf  History:        Patient has no prior history of Echocardiogram examinations.                 CAD, aortic balloon pump; Risk Factors:Current Smoker and                 Alcohol abuse.  Sonographer:    Johny Chess RDCS Referring Phys: [place]  Sonographer Comments: Technically difficult study due to poor echo windows. Image acquisition challenging due to patient body habitus, Image acquisition challenging due to respiratory motion and supine. IMPRESSIONS  1. Left ventricular ejection fraction, by estimation, is 50 to 55%. The left ventricle has low normal function. The left ventricle demonstrates regional wall motion abnormalities (see scoring diagram/findings for description). There is mild left ventricular hypertrophy. Left ventricular diastolic parameters are indeterminate.  2. Right ventricular systolic function is normal. The right ventricular size is normal. Tricuspid regurgitation signal is inadequate for assessing PA pressure.  3. The mitral valve is normal in structure. Trivial mitral valve regurgitation. No evidence of mitral stenosis.  4. The aortic valve is grossly normal. Aortic valve regurgitation is not visualized. No aortic stenosis is present.  5. The inferior vena cava is normal in size with greater than 50% respiratory variability, suggesting right atrial pressure of 3 mmHg. FINDINGS  Left Ventricle: Left ventricular ejection fraction, by estimation, is 50 to 55%. The left ventricle has low normal function. The left ventricle demonstrates regional wall motion abnormalities. Definity contrast agent was given IV to delineate the left ventricular endocardial borders. The left ventricular internal cavity size was normal in size. There is mild left  ventricular hypertrophy. Left ventricular diastolic parameters are indeterminate.  LV Wall Scoring: The mid anteroseptal segment is hypokinetic. Right Ventricle: The right ventricular size is normal. No increase in right ventricular wall thickness. Right ventricular systolic function is normal. Tricuspid regurgitation signal is inadequate for assessing PA pressure. Left Atrium: Left atrial size  was normal in size. Right Atrium: Right atrial size was normal in size. Pericardium: There is no evidence of pericardial effusion. Mitral Valve: The mitral valve is normal in structure. Trivial mitral valve regurgitation. No evidence of mitral valve stenosis. Tricuspid Valve: The tricuspid valve is normal in structure. Tricuspid valve regurgitation is not demonstrated. No evidence of tricuspid stenosis. Aortic Valve: The aortic valve is grossly normal. Aortic valve regurgitation is not visualized. No aortic stenosis is present. Pulmonic Valve: The pulmonic valve was normal in structure. Pulmonic valve regurgitation is not visualized. No evidence of pulmonic stenosis. Aorta: The aortic root is normal in size and structure. Venous: The inferior vena cava is normal in size with greater than 50% respiratory variability, suggesting right atrial pressure of 3 mmHg. IAS/Shunts: No atrial level shunt detected by color flow Doppler.  LEFT VENTRICLE PLAX 2D LVIDd:         5.10 cm   Diastology LVIDs:         4.10 cm   LV e' medial:    4.90 cm/s LV PW:         1.00 cm   LV E/e' medial:  11.4 LV IVS:        1.10 cm   LV e' lateral:   6.20 cm/s LVOT diam:     1.90 cm   LV E/e' lateral: 9.0 LV SV:         56 LV SV Index:   24 LVOT Area:     2.84 cm  RIGHT VENTRICLE             IVC RV Basal diam:  2.60 cm     IVC diam: 1.60 cm RV S prime:     12.80 cm/s TAPSE (M-mode): 1.7 cm LEFT ATRIUM             Index        RIGHT ATRIUM           Index LA diam:        3.30 cm 1.45 cm/m   RA Area:     14.90 cm LA Vol (A2C):   54.9 ml 24.16 ml/m  RA  Volume:   33.20 ml  14.61 ml/m LA Vol (A4C):   44.5 ml 19.58 ml/m LA Biplane Vol: 51.4 ml 22.62 ml/m  AORTIC VALVE LVOT Vmax:   119.00 cm/s LVOT Vmean:  73.700 cm/s LVOT VTI:    0.196 m  AORTA Ao Root diam: 3.30 cm Ao Asc diam:  3.60 cm MITRAL VALVE MV Area (PHT): 3.21 cm    SHUNTS MV Decel Time: 236 msec    Systemic VTI:  0.20 m MV E velocity: 55.90 cm/s  Systemic Diam: 1.90 cm MV A velocity: 57.80 cm/s MV E/A ratio:  0.97 Cherlynn Kaiser MD Electronically signed by Cherlynn Kaiser MD Signature Date/Time: 12/13/2022/3:28:55 PM    Final    [place] EKG SITE RITE  Result Date: 12/13/2022 If Site Rite image not attached, placement could not be confirmed due to current cardiac rhythm.    Medications:     Scheduled Medications:  aspirin EC  81 mg Oral Daily   atorvastatin  80 mg Oral Daily   Chlorhexidine Gluconate Cloth  6 each Topical Daily   folic acid  1 mg Oral Daily   losartan  12.5 mg Oral Daily   multivitamin with minerals  1 tablet Oral Daily   sodium chloride flush  10-40 mL Intracatheter Q12H   spironolactone  12.5 mg Oral Daily  thiamine  100 mg Oral Daily   Or   thiamine  100 mg Intravenous Daily   ticagrelor  90 mg Oral BID    Infusions:  sodium chloride      PRN Medications: acetaminophen, LORazepam **OR** LORazepam, morphine injection, nitroGLYCERIN, ondansetron (ZOFRAN) IV, mouth rinse, oxyCODONE, sodium chloride flush    Patient Profile   52 y.o. with history of bipolar disorder, ETOH abuse, HTN, ?TIA was admitted with anterolateral STEMI. C/b VF>> shocked x 2 and had CPR with rapid ROSC. Cath w/ occluded LAD and small LCx with 70% distal stenosis s/p LAD PCI. Required norepinephrine during case. IABP placed. Pt reported recent cocaine use.  Assessment/Plan   1. CAD: Anterolateral STEMI on 2/10, cath with occluded proximal LAD and 70% distal small LCx.  DES to proximal LAD with good result.  No further chest pain and resolution of STE on ECG.  Manage LCx medically.   - ASA 81 + ticagrelor 90 mg bid.  - continue atorvastatin 80 mg  2. VF arrest: In setting of STEMI, had DCCV x 2 with brief CPR.  Occasional PVCs post-PCI.   - now off amiodarone gtt as he has been revascularized.  - EF normal on echo, 50-55%  3. Acute systolic CHF/cardiogenic shock: Required transient norepinephrine in cath lab with elevated LVEDP (35 initially, down to 26 post-procedure). Echo post LAD revascularization showed normal LVEF 50-55%, RV normal. IABP removed 2/11. This morning, stable BP and asymptomatic. Co-ox 79%. CVP 4.    - Increase losartan to 25 daily.  - continue spironolactone 12.5 daily.  - start SGLT2i, Farxiga 10 mg daily  - consider low dose ? blocker 4. Smoking: Counseled to quit.  5. ETOH: Counseled to cut back/quit.  6. H/o TIA: Apparently he had some TIA-like symptoms in 2019, never had full workup though he saw neurology once.  Initial EKG this admit showed Afib. This was in setting of acute STEMI. Cannot rule out PAF, but would be hesitant to commit him to triple therapy unless recurrent AF is documented.  - Reasonable to consider LINQ monitor.  7. Thrombocytopenia: Chronic, mild. May be related to ETOH.  8. Cocaine Use: pt reported use within 24 hr of presentation - imperative to quit     Length of Stay: 2  Lyda Jester, PA-C  12/14/2022, 7:15 AM  Advanced Heart Failure Team Pager [phone] (M-F; 7a - 5p)  Please contact [facility] for night-coverage after hours (5p -7a ) and weekends on [email or web address]

## 2022-12-14 NOTE — Progress Notes (Addendum)
CARDIAC REHAB PHASE I   PRE:  Rate/Rhythm: 97 NSR  BP:  Sitting: 138/98      SaO2: 96 RA  MODE:  Ambulation: 370 ft   AD:  None  POST:  Rate/Rhythm: 107 ST  BP:  Sitting: 141/91      SaO2: 94 RA  Pt amb with standby assistance, pt denies CP and SOB during amb and was returned to room w/o complaint.  Pt walked well.  Pt was educated on stent card, stent location, Birlinta and ASA use, wt restrictions, no baths/daily wash-ups, s/s of infection, ex guidelines (progressive walking and resistance exercise), s/s to stop exercising, NTG use and calling 911, heart healthy diet, risk factors (smoking), and CRPII. Pt received materials on exercise, diet, and CRPII. Will refer to [facility].   Pt receptive to education, denies questions. Pt has insurance that will cover-BCBS.    Christen Bame  9:38 AM 12/14/2022    Service time is from 0851 to [phone].

## 2022-12-14 NOTE — Progress Notes (Signed)
   12/14/22 0950  Spiritual Encounters  Type of Visit Initial  Care provided to: Patient  Referral source Patient request  Reason for visit Routine spiritual support  OnCall Visit No  Spiritual Framework  Presenting Themes Meaning/purpose/sources of inspiration;Values and beliefs;Impactful experiences and emotions  Interventions  Spiritual Care Interventions Made Established relationship of care and support;Explored values/beliefs/practices/strengths;Prayer;Encouragement  Intervention Outcomes  Outcomes Connection to spiritual care;Connection to values and goals of care;Reduced fear   Pt was alert and energetically welcomed me into the room.  He spoke with enthusiasm about how lucky/blessed he feels to have survived.  Spoke at length about changes in his life and how his (girl) friends are going to help him.  Committed to eating "rabbit food" (his words) and getting back in church.

## 2022-12-15 ENCOUNTER — Other Ambulatory Visit (HOSPITAL_COMMUNITY): Payer: Self-pay

## 2022-12-15 DIAGNOSIS — I2102 ST elevation (STEMI) myocardial infarction involving left anterior descending coronary artery: Secondary | ICD-10-CM | POA: Diagnosis not present

## 2022-12-15 LAB — CBC
HCT: 47.3 % (ref 39.0–52.0)
Hemoglobin: 16 g/dL (ref 13.0–17.0)
MCH: 29.5 pg (ref 26.0–34.0)
MCHC: 33.8 g/dL (ref 30.0–36.0)
MCV: 87.1 fL (ref 80.0–100.0)
Platelets: 94 10*3/uL — ABNORMAL LOW (ref 150–400)
RBC: 5.43 MIL/uL (ref 4.22–5.81)
RDW: 12.6 % (ref 11.5–15.5)
WBC: 7.9 10*3/uL (ref 4.0–10.5)
nRBC: 0 % (ref 0.0–0.2)

## 2022-12-15 LAB — BASIC METABOLIC PANEL
Anion gap: 11 (ref 5–15)
BUN: 11 mg/dL (ref 6–20)
CO2: 26 mmol/L (ref 22–32)
Calcium: 8.8 mg/dL — ABNORMAL LOW (ref 8.9–10.3)
Chloride: 101 mmol/L (ref 98–111)
Creatinine, Ser: 0.98 mg/dL (ref 0.61–1.24)
GFR, Estimated: >60 mL/min (ref >60)
Glucose, Bld: 136 mg/dL — ABNORMAL HIGH (ref 70–99)
Potassium: 4 mmol/L (ref 3.5–5.1)
Sodium: 138 mmol/L (ref 135–145)

## 2022-12-15 LAB — COOXEMETRY PANEL
Carboxyhemoglobin: 1.7 % — ABNORMAL HIGH (ref 0.5–1.5)
Methemoglobin: <0.7 % (ref 0.0–1.5)
O2 Saturation: 69.7 %
Total hemoglobin: 16.1 g/dL — ABNORMAL HIGH (ref 12.0–16.0)

## 2022-12-15 LAB — LIPOPROTEIN A (LPA): Lipoprotein (a): 36.7 nmol/L — ABNORMAL HIGH (ref <75.0)

## 2022-12-15 MED ORDER — NITROGLYCERIN 0.4 MG SL SUBL
0.4000 mg | SUBLINGUAL_TABLET | SUBLINGUAL | 12 refills | Status: DC | PRN
  Filled 2022-12-15: qty 25, 7d supply, fill #0

## 2022-12-15 MED ORDER — DAPAGLIFLOZIN PROPANEDIOL 10 MG PO TABS
10.0000 mg | ORAL_TABLET | Freq: Every day | ORAL | 5 refills | Status: DC
  Filled 2022-12-15: qty 30, 30d supply, fill #0

## 2022-12-15 MED ORDER — ATORVASTATIN CALCIUM 80 MG PO TABS
80.0000 mg | ORAL_TABLET | Freq: Every day | ORAL | 5 refills | Status: DC
  Filled 2022-12-15: qty 30, 30d supply, fill #0

## 2022-12-15 MED ORDER — FOLIC ACID 1 MG PO TABS
1.0000 mg | ORAL_TABLET | Freq: Every day | ORAL | 5 refills | Status: DC
  Filled 2022-12-15: qty 30, 30d supply, fill #0

## 2022-12-15 MED ORDER — LOSARTAN POTASSIUM 25 MG PO TABS
25.0000 mg | ORAL_TABLET | Freq: Every day | ORAL | 5 refills | Status: DC
  Filled 2022-12-15: qty 30, 30d supply, fill #0

## 2022-12-15 MED ORDER — ASPIRIN 81 MG PO TBEC
81.0000 mg | DELAYED_RELEASE_TABLET | Freq: Every day | ORAL | 12 refills | Status: DC
  Filled 2022-12-15: qty 30, 30d supply, fill #0

## 2022-12-15 MED ORDER — CARVEDILOL 3.125 MG PO TABS
3.1250 mg | ORAL_TABLET | Freq: Two times a day (BID) | ORAL | 5 refills | Status: DC
  Filled 2022-12-15: qty 60, 30d supply, fill #0

## 2022-12-15 MED ORDER — THIAMINE HCL 100 MG PO TABS
100.0000 mg | ORAL_TABLET | Freq: Every day | ORAL | 5 refills | Status: AC
  Filled 2022-12-15: qty 30, 30d supply, fill #0

## 2022-12-15 MED ORDER — TICAGRELOR 90 MG PO TABS
90.0000 mg | ORAL_TABLET | Freq: Two times a day (BID) | ORAL | 11 refills | Status: DC
  Filled 2022-12-15: qty 60, 30d supply, fill #0

## 2022-12-15 MED ORDER — SPIRONOLACTONE 25 MG PO TABS
25.0000 mg | ORAL_TABLET | Freq: Every day | ORAL | 5 refills | Status: DC
  Filled 2022-12-15: qty 30, 30d supply, fill #0

## 2022-12-15 MED ORDER — SPIRONOLACTONE 25 MG PO TABS
25.0000 mg | ORAL_TABLET | Freq: Every day | ORAL | Status: DC
  Administered 2022-12-15: 25 mg via ORAL
  Filled 2022-12-15: qty 1

## 2022-12-15 NOTE — TOC Transition Note (Addendum)
Transition of Care [facility]) - CM/SW Discharge Note   Patient Details  Name: Christopher [name] MRN: [id number] Date of Birth: June 08, 1971  [facility] CM/SW Contact:  Erenest Rasher, RN Phone Number: [phone] 12/15/2022, 11:39 AM   Clinical Narrative:    Provided pt with note for work. He will follow up with PCP to help complete FMLA paperwork. Appt arranged with his PCP, [name]  Pt was provided resources for substance abuse. States he plans to quit and values his heatlh.   Final next level of care: Home/Self Care Barriers to Discharge: No Barriers Identified   Patient Goals and CMS Choice CMS Medicare.gov Compare Post Acute Care list provided to:: Patient    Discharge Placement                         Discharge Plan and Services Additional resources added to the After Visit Summary for     Discharge Planning Services: CM Consult                                 Social Determinants of Health (SDOH) Interventions SDOH Screenings   Food Insecurity: No Food Insecurity (12/13/2022)  Housing: Low Risk  (12/13/2022)  Transportation Needs: No Transportation Needs (12/13/2022)  Utilities: Not At Risk (12/13/2022)  Tobacco Use: Medium Risk (12/14/2022)     Readmission Risk Interventions     No data to display

## 2022-12-15 NOTE — Plan of Care (Signed)

## 2022-12-15 NOTE — Discharge Summary (Signed)
Advanced Heart Failure Team  Discharge Summary   Patient ID: Christopher [name] MRN: [id number], DOB/AGE: 07-Sep-1971 52 y.o. Admit date: 12/12/2022 D/C date:     12/15/2022   Primary Discharge Diagnoses:  CAD VF arrest Acute sytolic CHF Cardiogenic shock  Secondary Discharge Diagnoses:  Smoking abuse ETOH use H/o TIA Thrombocytopenia Cocaine use  Hospital Course:  Mr. [name] is a 52 y.o. with history of bipolar disorder, ETOH abuse, HTN, ?TIA. He was admitted with anterolateral STEMI.   Presented with sudden onset sharp chest pain that begin while he was working. Patients girlfriend brought him to the ED where he suddenly collapsed. Witnessed VF arrest>>shocked x 2 and had brief CPR with rapid ROSC. EKG concerning for STEMI, code STEMI promptly activated and was taken to the cath lab. Cath w/ occluded LAD and small LCx with 70% distal stenosis s/p LAD PCI. Required norepinephrine during case. IABP placed.    IABP able to be weaned off 2/11. Stable off pressors and transferred to floor. No complaints at discharge. No further VT, stable off amiodarone.   Close f/u arranged with cardiology. Dr Daniel Nones evaluated and deemed appropriate for discharge. PICC line removed. All meds sent to [place].  See below for detailed problem list: 1. CAD: Anterolateral STEMI on 2/10, cath with occluded proximal LAD and 70% distal small LCx.  DES to proximal LAD with good result.  No further chest pain and resolution of STE on ECG.  Manage LCx medically.  - ASA 81 + ticagrelor 90 mg bid.  - continue atorvastatin 80 mg  2. VF arrest: In setting of STEMI, had DCCV x 2 with brief CPR.  Occasional PVCs post-PCI.   - now off amiodarone gtt as he has been revascularized.  - EF normal on echo, 50-55%  3. Acute systolic CHF/cardiogenic shock: Required transient norepinephrine in cath lab with elevated LVEDP (35 initially, down to 26 post-procedure). Echo post LAD revascularization showed normal LVEF 50-55%,  RV normal. IABP removed 2/11. This morning, stable BP. Co-ox 70%. CVP 3.    - Continue losartan to 25 daily.  - Continue spironolactone 25 mg daily.  - Continue Farxiga 10 mg daily  - Continue coreg 3.125 BID 4. Smoking: Counseled to quit.  5. ETOH: Counseled to cut back/quit.  6. H/o TIA: Apparently he had some TIA-like symptoms in 2019, never had full workup though he saw neurology once.  Initial EKG this admit showed Afib. This was in setting of acute STEMI. Cannot rule out PAF, but would be hesitant to commit him to triple therapy unless recurrent AF is documented.  - Reasonable to consider LINQ monitor.  7. Thrombocytopenia: Chronic, mild. May be related to ETOH.  8. Cocaine Use: pt reported use within 24 hr of presentation - imperative to quit  Discharge Weight Range: 105.6 kg Discharge Vitals: Blood pressure 118/85, pulse 98, temperature 98.3 F (36.8 C), temperature source Oral, resp. rate 18, height 5' 10"$  (1.778 m), weight 105.6 kg, SpO2 94 %. Labs: Lab Results  Component Value Date   WBC 7.9 12/15/2022   HGB 16.0 12/15/2022   HCT 47.3 12/15/2022   MCV 87.1 12/15/2022   PLT 94 (L) 12/15/2022   Recent Labs  Lab 12/12/22 1538 12/12/22 1552 12/15/22 0400  NA 140   < > 138  K 2.8*   < > 4.0  CL 107   < > 101  CO2 18*   < > 26  BUN 8   < > 11  CREATININE 1.40*   < >  0.98  CALCIUM 8.0*   < > 8.8*  PROT 6.2*  --   --   BILITOT 0.9  --   --   ALKPHOS 63  --   --   ALT 62*  --   --   AST 50*  --   --   GLUCOSE 128*   < > 136*   < > = values in this interval not displayed.   Lab Results  Component Value Date   CHOL 195 12/12/2022   HDL 36 (L) 12/12/2022   LDLCALC 133 (H) 12/12/2022   TRIG 130 12/12/2022   Diagnostic Studies/Procedures   ECHOCARDIOGRAM COMPLETE  Result Date: 12/13/2022    ECHOCARDIOGRAM REPORT   Patient Name:   Christopher [name] Date of Exam: 12/13/2022 Medical Rec #:  [id number]        Height:       70.0 in Accession #:    [id number]       Weight:        244.3 lb Date of Birth:  1971/04/23        BSA:          2.272 m Patient Age:    52 years         BP:           106/92 mmHg Patient Gender: M                HR:           78 bpm. Exam Location:  Inpatient Procedure: 2D Echo, Cardiac Doppler, Color Doppler and Intracardiac            Opacification Agent Indications:    acute systolic chf  History:        Patient has no prior history of Echocardiogram examinations.                 CAD, aortic balloon pump; Risk Factors:Current Smoker and                 Alcohol abuse.  Sonographer:    Johny Chess RDCS Referring Phys: [place]  Sonographer Comments: Technically difficult study due to poor echo windows. Image acquisition challenging due to patient body habitus, Image acquisition challenging due to respiratory motion and supine. IMPRESSIONS  1. Left ventricular ejection fraction, by estimation, is 50 to 55%. The left ventricle has low normal function. The left ventricle demonstrates regional wall motion abnormalities (see scoring diagram/findings for description). There is mild left ventricular hypertrophy. Left ventricular diastolic parameters are indeterminate.  2. Right ventricular systolic function is normal. The right ventricular size is normal. Tricuspid regurgitation signal is inadequate for assessing PA pressure.  3. The mitral valve is normal in structure. Trivial mitral valve regurgitation. No evidence of mitral stenosis.  4. The aortic valve is grossly normal. Aortic valve regurgitation is not visualized. No aortic stenosis is present.  5. The inferior vena cava is normal in size with greater than 50% respiratory variability, suggesting right atrial pressure of 3 mmHg. FINDINGS  Left Ventricle: Left ventricular ejection fraction, by estimation, is 50 to 55%. The left ventricle has low normal function. The left ventricle demonstrates regional wall motion abnormalities. Definity contrast agent was given IV to delineate the left ventricular  endocardial borders. The left ventricular internal cavity size was normal in size. There is mild left ventricular hypertrophy. Left ventricular diastolic parameters are indeterminate.  LV Wall Scoring: The mid anteroseptal segment is hypokinetic. Right Ventricle: The right ventricular  size is normal. No increase in right ventricular wall thickness. Right ventricular systolic function is normal. Tricuspid regurgitation signal is inadequate for assessing PA pressure. Left Atrium: Left atrial size was normal in size. Right Atrium: Right atrial size was normal in size. Pericardium: There is no evidence of pericardial effusion. Mitral Valve: The mitral valve is normal in structure. Trivial mitral valve regurgitation. No evidence of mitral valve stenosis. Tricuspid Valve: The tricuspid valve is normal in structure. Tricuspid valve regurgitation is not demonstrated. No evidence of tricuspid stenosis. Aortic Valve: The aortic valve is grossly normal. Aortic valve regurgitation is not visualized. No aortic stenosis is present. Pulmonic Valve: The pulmonic valve was normal in structure. Pulmonic valve regurgitation is not visualized. No evidence of pulmonic stenosis. Aorta: The aortic root is normal in size and structure. Venous: The inferior vena cava is normal in size with greater than 50% respiratory variability, suggesting right atrial pressure of 3 mmHg. IAS/Shunts: No atrial level shunt detected by color flow Doppler.  LEFT VENTRICLE PLAX 2D LVIDd:         5.10 cm   Diastology LVIDs:         4.10 cm   LV e' medial:    4.90 cm/s LV PW:         1.00 cm   LV E/e' medial:  11.4 LV IVS:        1.10 cm   LV e' lateral:   6.20 cm/s LVOT diam:     1.90 cm   LV E/e' lateral: 9.0 LV SV:         56 LV SV Index:   24 LVOT Area:     2.84 cm  RIGHT VENTRICLE             IVC RV Basal diam:  2.60 cm     IVC diam: 1.60 cm RV S prime:     12.80 cm/s TAPSE (M-mode): 1.7 cm LEFT ATRIUM             Index        RIGHT ATRIUM           Index  LA diam:        3.30 cm 1.45 cm/m   RA Area:     14.90 cm LA Vol (A2C):   54.9 ml 24.16 ml/m  RA Volume:   33.20 ml  14.61 ml/m LA Vol (A4C):   44.5 ml 19.58 ml/m LA Biplane Vol: 51.4 ml 22.62 ml/m  AORTIC VALVE LVOT Vmax:   119.00 cm/s LVOT Vmean:  73.700 cm/s LVOT VTI:    0.196 m  AORTA Ao Root diam: 3.30 cm Ao Asc diam:  3.60 cm MITRAL VALVE MV Area (PHT): 3.21 cm    SHUNTS MV Decel Time: 236 msec    Systemic VTI:  0.20 m MV E velocity: 55.90 cm/s  Systemic Diam: 1.90 cm MV A velocity: 57.80 cm/s MV E/A ratio:  0.97 Cherlynn Kaiser MD Electronically signed by Cherlynn Kaiser MD Signature Date/Time: 12/13/2022/3:28:55 PM    Final     2/10 cath with occluded proximal LAD and 70% distal small LCx.  DES to proximal LAD with good result   Discharge Medications   Allergies as of 12/15/2022   No Known Allergies      Medication List     STOP taking these medications    amLODipine 5 MG tablet Commonly known as: NORVASC       TAKE these medications    albuterol 108 (90  Base) MCG/ACT inhaler Commonly known as: VENTOLIN HFA Inhale 2 puffs into the lungs every 6 (six) hours as needed. For shortness of breath   ARIPiprazole 10 MG tablet Commonly known as: ABILIFY Take 10 mg by mouth daily.   aspirin EC 81 MG tablet Take 1 tablet (81 mg total) by mouth daily. Swallow whole. Start taking on: December 16, 2022   atorvastatin 80 MG tablet Commonly known as: LIPITOR Take 1 tablet (80 mg total) by mouth daily. Start taking on: December 16, 2022   carvedilol 3.125 MG tablet Commonly known as: COREG Take 1 tablet (3.125 mg total) by mouth 2 (two) times daily with a meal.   cetirizine 10 MG tablet Commonly known as: ZYRTEC Take 10 mg by mouth daily as needed.   dapagliflozin propanediol 10 MG Tabs tablet Commonly known as: FARXIGA Take 1 tablet (10 mg total) by mouth daily. Start taking on: December 16, 2022   divalproex 250 MG DR tablet Commonly known as: DEPAKOTE Take 1,000  mg by mouth at bedtime.   folic acid 1 MG tablet Commonly known as: FOLVITE Take 1 tablet (1 mg total) by mouth daily. Start taking on: December 16, 2022   losartan 25 MG tablet Commonly known as: COZAAR Take 1 tablet (25 mg total) by mouth daily. Start taking on: December 16, 2022   MULTIVITAMIN ADULT PO Take by mouth.   nitroGLYCERIN 0.4 MG SL tablet Commonly known as: NITROSTAT Place 1 tablet (0.4 mg total) under the tongue every 5 (five) minutes x 3 doses as needed for chest pain.   omeprazole 20 MG capsule Commonly known as: PRILOSEC Take 20 mg by mouth as needed.   QUEtiapine 25 MG tablet Commonly known as: SEROQUEL Take 25 mg by mouth at bedtime.   spironolactone 25 MG tablet Commonly known as: ALDACTONE Take 1 tablet (25 mg total) by mouth daily. Start taking on: December 16, 2022   thiamine 100 MG tablet Commonly known as: VITAMIN B1 Take 1 tablet (100 mg total) by mouth daily. Start taking on: December 16, 2022   ticagrelor 90 MG Tabs tablet Commonly known as: BRILINTA Take 1 tablet (90 mg total) by mouth 2 (two) times daily.   traZODone 50 MG tablet Commonly known as: DESYREL Take 50 mg by mouth at bedtime.   venlafaxine XR 75 MG 24 hr capsule Commonly known as: EFFEXOR-XR Take 75 mg by mouth daily.        Disposition   The patient will be discharged in stable condition to home. Discharge Instructions     (HEART FAILURE PATIENTS) Call MD:  Anytime you have any of the following symptoms: 1) 3 pound weight gain in 24 hours or 5 pounds in 1 week 2) shortness of breath, with or without a dry hacking cough 3) swelling in the hands, feet or stomach 4) if you have to sleep on extra pillows at night in order to breathe.   Complete by: As directed    Amb Referral to Cardiac Rehabilitation   Complete by: As directed    Diagnosis:  STEMI Coronary Stents     After initial evaluation and assessments completed: Virtual Based Care may be provided alone or  in conjunction with Phase 2 Cardiac Rehab based on patient barriers.: Yes   Intensive Cardiac Rehabilitation (ICR) [place] location only OR Traditional Cardiac Rehabilitation (TCR) *If criteria for ICR are not met will enroll in TCR [facility] only): Yes   Diet - low sodium heart healthy   Complete by:  As directed    Increase activity slowly   Complete by: As directed    No wound care   Complete by: As directed        Follow-up Information     [name], DO. Go on 12/18/2022.   Specialty: Family Medicine Why: @10$ :30am Contact information: [address] [place] [address] [phone]         Elgie Collard, PA-C Follow up on 12/23/2022.   Specialty: Cardiology Why: Follow up with cardiology 12/23/22 at 1005am  Please bring medications with you. Contact information: [address] [place] [address] [phone]                   Duration of Discharge Encounter: Greater than 35 minutes   Signed, Earnie Larsson AGACNP-BC  12/15/2022, 12:07 PM

## 2022-12-15 NOTE — Progress Notes (Signed)
CARDIAC REHAB PHASE I   PRE:  Rate/Rhythm: 90 NSR  BP:  Sitting: 120/81      SaO2: 96 RA  MODE:  Ambulation: 470 ft   AD:  None  POST:  Rate/Rhythm: 102 ST  BP:  Sitting: 115/87      SaO2: 95 RA  Pt amb with standby assistance, pt denies CP and SOB during amb and was returned to room w/o complaint.   Pt walked well w/o difficulty   Encouraged HH diet and exercise, pt has been referred to [facility].  Christen Bame  9:20 AM 12/15/2022    Service time is from 0905 to [id number].

## 2022-12-15 NOTE — Progress Notes (Signed)
Advanced Heart Failure Rounding Note  PCP-Cardiologist: None    Patient Profile  52 y.o. with history of bipolar disorder, ETOH abuse, HTN, ?TIA was admitted with anterolateral STEMI, C/b VF>> shocked x 2 and had CPR with rapid ROSC. Initial EKG atrial fibrillation with ST elevation.  Cath w/ occluded LAD and small LCx with 70% distal stenosis s/p LAD PCI. Required norepinephrine during case. IABP placed. Patient reported recent cocaine use.  Subjective:   Echo EF 50-55%, trivial MR, normal RV  IABP removed 2/11. Now on the floor. SBPs 110s. Renal fx stable. Sr Cr .98  Co-ox 70%. CVP 3   Feels good. Complaining of chest soreness from compressions. Otherwise ambulating w/o issues. No SOB.   Objective:   Weight Range: 105.6 kg Body mass index is 33.4 kg/m.   Vital Signs:   Temp:  [98.2 F (36.8 C)-98.9 F (37.2 C)] 98.2 F (36.8 C) (02/13 0346) Pulse Rate:  [77-104] 77 (02/13 0346) Resp:  [16-23] 18 (02/13 0346) BP: (116-151)/(79-109) 116/79 (02/13 0346) SpO2:  [94 %-96 %] 95 % (02/13 0346) Weight:  [105.6 kg] 105.6 kg (02/13 0400) Last BM Date : 12/14/22  Weight change: Filed Weights   12/12/22 1930 12/14/22 0600 12/15/22 0400  Weight: 110.8 kg 107 kg 105.6 kg    Intake/Output:   Intake/Output Summary (Last 24 hours) at 12/15/2022 0734 Last data filed at 12/15/2022 0400 Gross per 24 hour  Intake 360 ml  Output 1750 ml  Net -1390 ml      Physical Exam   CVP 3  General:  well appearing.  No respiratory difficulty HEENT: normal Neck: supple. No JVD. Carotids 2+ bilat; no bruits. No lymphadenopathy or thyromegaly appreciated. Cor: PMI nondisplaced. Regular rate & rhythm. No rubs, gallops or murmurs. Lungs: clear Abdomen: soft, nontender, nondistended. No hepatosplenomegaly. No bruits or masses. Good bowel sounds. Extremities: no cyanosis, clubbing, rash, edema. PICC RUE Neuro: alert & oriented x 3, cranial nerves grossly intact. moves all 4 extremities w/o  difficulty. Affect pleasant.   Telemetry   ST low 100s (Personally reviewed)    EKG    No new EKG to review  Labs  CBC Recent Labs    12/12/22 1538 12/12/22 1552 12/14/22 0607 12/15/22 0400  WBC 12.4*   < > 8.2 7.9  NEUTROABS 5.1  --   --   --   HGB 16.3   < > 16.4 16.0  HCT 49.2   < > 47.7 47.3  MCV 89.5   < > 86.9 87.1  PLT 147*   < > 99* 94*   < > = values in this interval not displayed.   Basic Metabolic Panel Recent Labs    12/12/22 2013 12/13/22 0406 12/14/22 0607 12/15/22 0400  NA 138   < > 138 138  K 3.7   < > 4.0 4.0  CL 104   < > 103 101  CO2 21*   < > 26 26  GLUCOSE 164*   < > 155* 136*  BUN 9   < > 9 11  CREATININE 1.16   < > 0.96 0.98  CALCIUM 8.9   < > 8.9 8.8*  MG 2.5*  --   --   --    < > = values in this interval not displayed.   Liver Function Tests Recent Labs    12/12/22 1538  AST 50*  ALT 62*  ALKPHOS 63  BILITOT 0.9  PROT 6.2*  ALBUMIN 3.5   Hemoglobin  A1C Recent Labs    12/12/22 1538  HGBA1C 5.9*   Fasting Lipid Panel Recent Labs    12/12/22 1538  CHOL 195  HDL 36*  LDLCALC 133*  TRIG 130  CHOLHDL 5.4   Other results:  Imaging   No results found.  Medications:   Scheduled Medications:  ARIPiprazole  10 mg Oral Daily   aspirin EC  81 mg Oral Daily   atorvastatin  80 mg Oral Daily   carvedilol  3.125 mg Oral BID WC   Chlorhexidine Gluconate Cloth  6 each Topical Daily   dapagliflozin propanediol  10 mg Oral Daily   divalproex  1,000 mg Oral QHS   folic acid  1 mg Oral Daily   losartan  25 mg Oral Daily   multivitamin with minerals  1 tablet Oral Daily   QUEtiapine  25 mg Oral QHS   sodium chloride flush  10-40 mL Intracatheter Q12H   spironolactone  12.5 mg Oral Daily   thiamine  100 mg Oral Daily   Or   thiamine  100 mg Intravenous Daily   ticagrelor  90 mg Oral BID   traZODone  50 mg Oral QHS   venlafaxine XR  75 mg Oral Daily    Infusions:  sodium chloride      PRN  Medications: acetaminophen, LORazepam **OR** LORazepam, morphine injection, nitroGLYCERIN, ondansetron (ZOFRAN) IV, mouth rinse, oxyCODONE, sodium chloride flush  Patient Profile  52 y.o. with history of bipolar disorder, ETOH abuse, HTN, ?TIA was admitted with anterolateral STEMI. C/b VF>> shocked x 2 and had CPR with rapid ROSC. Cath w/ occluded LAD and small LCx with 70% distal stenosis s/p LAD PCI. Required norepinephrine during case. IABP placed. Pt reported recent cocaine use. Assessment/Plan  1. CAD: Anterolateral STEMI on 2/10, cath with occluded proximal LAD and 70% distal small LCx.  DES to proximal LAD with good result.  No further chest pain and resolution of STE on ECG.  Manage LCx medically.  - ASA 81 + ticagrelor 90 mg bid.  - continue atorvastatin 80 mg  2. VF arrest: In setting of STEMI, had DCCV x 2 with brief CPR.  Occasional PVCs post-PCI.   - now off amiodarone gtt as he has been revascularized.  - EF normal on echo, 50-55%  3. Acute systolic CHF/cardiogenic shock: Required transient norepinephrine in cath lab with elevated LVEDP (35 initially, down to 26 post-procedure). Echo post LAD revascularization showed normal LVEF 50-55%, RV normal. IABP removed 2/11. This morning, stable BP. Co-ox 70%. CVP 3.    - Continue losartan to 25 daily.  - Increase spironolactone 12.5>25 mg daily.  - Continue Farxiga 10 mg daily  - Continue coreg 3.125 BID 4. Smoking: Counseled to quit.  5. ETOH: Counseled to cut back/quit.  6. H/o TIA: Apparently he had some TIA-like symptoms in 2019, never had full workup though he saw neurology once.  Initial EKG this admit showed Afib. This was in setting of acute STEMI. Cannot rule out PAF, but would be hesitant to commit him to triple therapy unless recurrent AF is documented.  - Reasonable to consider LINQ monitor.  7. Thrombocytopenia: Chronic, mild. May be related to ETOH.  8. Cocaine Use: pt reported use within 24 hr of presentation - imperative  to quit   Appears stable for discharge pending MD eval.   AHF meds at d/c: ASA 81 mg daily Atorvastatin 80 mg daily Carvedilol 3.125 mg BID  Farxiga 10 mg daily Losartan 25 mg daily  Spironolactone 25 mg daily Ticagrelor 90 mg BID  Length of Stay: [place], NP  12/15/2022, 7:34 AM  Advanced Heart Failure Team Pager [phone] (M-F; 7a - 5p)  Please contact [facility] for night-coverage after hours (5p -7a ) and weekends on [email or web address]

## 2022-12-15 NOTE — TOC Initial Note (Addendum)
Transition of Care [facility]) - Initial/Assessment Note    Patient Details  Name: Christopher [name] MRN: [id number] Date of Birth: 06/26/1971  Transition of [facility]) CM/SW Contact:    Erenest Rasher, RN Phone Number: [phone] 12/15/2022, 11:27 AM  Clinical Narrative:                  HF TOC CM spoke to pt and states he will pick up a scale to do daily weight. Educated pt on low sodium, heart healthy diet. Pt reporting a note for work. States he needs his FMLA paperwork completed. Explained he can have paperwork sent to his PCP to complete.   PCP Hinton Dyer Collins-appt arranged    Expected Discharge Plan: Home/Self Care Barriers to Discharge: No Barriers Identified   Patient Goals and CMS Choice Patient states their goals for this hospitalization and ongoing recovery are:: wants to recover CMS Medicare.gov Compare Post Acute Care list provided to:: Patient        Expected Discharge Plan and Services   Discharge Planning Services: CM Consult   Living arrangements for the past 2 months: Single Family Home Expected Discharge Date: 12/15/22                                    Prior Living Arrangements/Services Living arrangements for the past 2 months: Single Family Home Lives with:: Significant Other Patient language and need for interpreter reviewed:: Yes Do you feel safe going back to the place where you live?: Yes      Need for Family Participation in Patient Care: No (Comment) Care giver support system in place?: Yes (comment)   Criminal Activity/Legal Involvement Pertinent to Current Situation/Hospitalization: No - Comment as needed  Activities of Daily Living Home Assistive Devices/Equipment: None, Eyeglasses (states glasses are for long distance and night time driving but doesn't wear them) ADL Screening (condition at time of admission) Patient's cognitive ability adequate to safely complete daily activities?: Yes Is the patient deaf or have difficulty  hearing?: No Does the patient have difficulty seeing, even when wearing glasses/contacts?: No Does the patient have difficulty concentrating, remembering, or making decisions?: No Patient able to express need for assistance with ADLs?: Yes Does the patient have difficulty dressing or bathing?: No Independently performs ADLs?: Yes (appropriate for developmental age) Does the patient have difficulty walking or climbing stairs?: No Weakness of Legs: None Weakness of Arms/Hands: None  Permission Sought/Granted Permission sought to share information with : Case Manager, Family Supports, PCP Permission granted to share information with : Yes, Verbal Permission Granted  Share Information with NAME: Aviva Kluver     Permission granted to share info w Relationship: SO  Permission granted to share info w Contact Information: [phone]  Emotional Assessment Appearance:: Appears stated age Attitude/Demeanor/Rapport: Engaged Affect (typically observed): Accepting Orientation: : Oriented to Self, Oriented to Place, Oriented to  Time, Oriented to Situation   Psych Involvement: No (comment)  Admission diagnosis:  STEMI (ST elevation myocardial infarction) ([place]) [I21.3] ST elevation myocardial infarction (STEMI), unspecified artery ([place]) [I21.3] STEMI involving left anterior descending coronary artery ([place]) [I21.02] Patient Active Problem List   Diagnosis Date Noted   STEMI involving left anterior descending coronary artery ([place]) 12/12/2022   Thrombocytopenia ([place]) 11/21/2018   ETOH abuse 11/21/2018   Elevated LFTs 11/21/2018   PCP:  [name], DO Pharmacy:   [place], [place] - [address] [place] [address] Phone: [phone] Fax: [phone]  [place], [place] [address] [place] [address] Phone: [phone] Fax: [phone]  [facility] [id number] [place], [place] - [place] [address] Phone: [phone] Fax: [place] [address] [place] [address] Phone: [phone] Fax: [phone]     Social Determinants of Health (SDOH) Social History: SDOH Screenings   Food Insecurity: No Food Insecurity (12/13/2022)  Housing: Low Risk  (12/13/2022)  Transportation Needs: No Transportation Needs (12/13/2022)  Utilities: Not At Risk (12/13/2022)  Tobacco Use: Medium Risk (12/14/2022)   SDOH Interventions:     Readmission Risk Interventions     No data to display

## 2022-12-18 DIAGNOSIS — Z955 Presence of coronary angioplasty implant and graft: Secondary | ICD-10-CM | POA: Diagnosis not present

## 2022-12-18 DIAGNOSIS — I213 ST elevation (STEMI) myocardial infarction of unspecified site: Secondary | ICD-10-CM | POA: Diagnosis not present

## 2022-12-18 DIAGNOSIS — I469 Cardiac arrest, cause unspecified: Secondary | ICD-10-CM | POA: Diagnosis not present

## 2022-12-18 DIAGNOSIS — Z09 Encounter for follow-up examination after completed treatment for conditions other than malignant neoplasm: Secondary | ICD-10-CM | POA: Diagnosis not present
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
## 2022-12-23 ENCOUNTER — Encounter: Payer: Self-pay | Admitting: Physician Assistant

## 2022-12-23 ENCOUNTER — Ambulatory Visit: Payer: BC Managed Care – PPO | Attending: Physician Assistant | Admitting: Physician Assistant

## 2022-12-23 ENCOUNTER — Encounter: Payer: Self-pay | Admitting: *Deleted

## 2022-12-23 VITALS — BP 142/88 | HR 114 | Ht 70.0 in | Wt 237.0 lb

## 2022-12-23 DIAGNOSIS — I2510 Atherosclerotic heart disease of native coronary artery without angina pectoris: Secondary | ICD-10-CM | POA: Diagnosis not present

## 2022-12-23 DIAGNOSIS — R570 Cardiogenic shock: Secondary | ICD-10-CM | POA: Diagnosis not present

## 2022-12-23 DIAGNOSIS — I5043 Acute on chronic combined systolic (congestive) and diastolic (congestive) heart failure: Secondary | ICD-10-CM

## 2022-12-23 MED ORDER — CARVEDILOL 6.25 MG PO TABS: 6.2500 mg | ORAL_TABLET | Freq: Two times a day (BID) | ORAL | 3 refills | Status: DC

## 2022-12-23 NOTE — Patient Instructions (Addendum)
Medication Instructions:  1.Increase carvedilol (Coreg) to 6.25 mg twice daily. *If you need a refill on your cardiac medications before your next appointment, please call your pharmacy*   Lab Work: Fasting lipids and lft's in 6 weeks. If you have labs (blood work) drawn today and your tests are completely normal, you will receive your results only by: [place] (if you have MyChart) OR A paper copy in the mail If you have any lab test that is abnormal or we need to change your treatment, we will call you to review the results.   Testing/Procedures: Your physician has requested that you have an echocardiogram in 3 months. Echocardiography is a painless test that uses sound waves to create images of your heart. It provides your doctor with information about the size and shape of your heart and how well your heart's chambers and valves are working. This procedure takes approximately one hour. There are no restrictions for this procedure. Please do NOT wear cologne, perfume, aftershave, or lotions (deodorant is allowed). Please arrive 15 minutes prior to your appointment time.    Follow-Up: At [facility], you and your health needs are our priority.  As part of our continuing mission to provide you with exceptional heart care, we have created designated Provider Care Teams.  These Care Teams include your primary Cardiologist (physician) and Advanced Practice Providers (APPs -  Physician Assistants and Nurse Practitioners) who all work together to provide you with the care you need, when you need it.   Your next appointment:   3 month(s)  Provider:   Nicholes Rough, PA-C        Other Instructions Check your blood pressure and heart rate at home daily, one hour after taking your morning medications for 2 weeks, keep a log and send [place] the readings through mychart at the end of the 2 weeks.

## 2022-12-24 ENCOUNTER — Telehealth (HOSPITAL_COMMUNITY): Payer: Self-pay

## 2022-12-24 NOTE — Telephone Encounter (Signed)
Pt insurance is active and benefits verified through [employer] Co-pay 0, DED $1,600/$23.73 met, out of pocket $4,500/$23.73 met, co-insurance 20%. no pre-authorization required, Tammy/[employer] 12/24/2022@2$ :08pm, REF# [id number]   How many CR sessions are covered? (72 sessions for ICR)no limit Is this a lifetime maximum or an annual maximum? annual Has the member used any of these services to date? no Is there a time limit (weeks/months) on start of program and/or program completion? no     Will contact patient to see if he is interested in the Cardiac Rehab Program. If interested, patient will need to complete follow up appt. Once completed, patient will be contacted for scheduling upon review by the RN Navigator.

## 2022-12-24 NOTE — Telephone Encounter (Signed)
Called patient to see if he is interested in the Cardiac Rehab Program. Patient expressed interest. Explained scheduling process and went over insurance, patient verbalized understanding. Will contact patient for scheduling once f/u has been completed.  °

## 2022-12-30 DIAGNOSIS — F319 Bipolar disorder, unspecified: Secondary | ICD-10-CM | POA: Diagnosis not present

## 2023-01-14 ENCOUNTER — Telehealth (HOSPITAL_COMMUNITY): Payer: Self-pay

## 2023-01-14 ENCOUNTER — Encounter (HOSPITAL_COMMUNITY): Payer: Self-pay

## 2023-01-14 NOTE — Telephone Encounter (Signed)
Attempted to call patient in regards to Cardiac Rehab - LM on VM Mailed letter 

## 2023-01-15 ENCOUNTER — Other Ambulatory Visit: Payer: Self-pay

## 2023-01-15 MED ORDER — NITROGLYCERIN 0.4 MG SL SUBL: 0.4000 mg | SUBLINGUAL_TABLET | SUBLINGUAL | 11 refills | Status: DC | PRN

## 2023-01-15 MED ORDER — TICAGRELOR 90 MG PO TABS: 90.0000 mg | ORAL_TABLET | Freq: Two times a day (BID) | ORAL | 3 refills | Status: DC

## 2023-01-15 MED ORDER — LOSARTAN POTASSIUM 25 MG PO TABS: 25.0000 mg | ORAL_TABLET | Freq: Every day | ORAL | 3 refills | Status: DC

## 2023-01-15 MED ORDER — SPIRONOLACTONE 25 MG PO TABS: 25.0000 mg | ORAL_TABLET | Freq: Every day | ORAL | 3 refills | Status: AC

## 2023-01-15 MED ORDER — ATORVASTATIN CALCIUM 80 MG PO TABS: 80.0000 mg | ORAL_TABLET | Freq: Every day | ORAL | 3 refills | Status: AC

## 2023-01-15 MED ORDER — DAPAGLIFLOZIN PROPANEDIOL 10 MG PO TABS: 10.0000 mg | ORAL_TABLET | Freq: Every day | ORAL | 3 refills | Status: AC

## 2023-01-15 NOTE — Telephone Encounter (Signed)
Pt is requesting a refill on folic acid. Would Dr. Burt Knack like to refill this medication? Please address

## 2023-01-27 DIAGNOSIS — F319 Bipolar disorder, unspecified: Secondary | ICD-10-CM | POA: Diagnosis not present

## 2023-02-05 ENCOUNTER — Ambulatory Visit: Payer: BC Managed Care – PPO | Attending: Physician Assistant

## 2023-02-05 DIAGNOSIS — R570 Cardiogenic shock: Secondary | ICD-10-CM

## 2023-02-05 DIAGNOSIS — I2510 Atherosclerotic heart disease of native coronary artery without angina pectoris: Secondary | ICD-10-CM | POA: Diagnosis not present

## 2023-02-05 DIAGNOSIS — I5043 Acute on chronic combined systolic (congestive) and diastolic (congestive) heart failure: Secondary | ICD-10-CM | POA: Diagnosis not present

## 2023-02-06 LAB — LIPID PANEL
Chol/HDL Ratio: 4 ratio (ref 0.0–5.0)
Cholesterol, Total: 141 mg/dL (ref 100–199)
HDL: 35 mg/dL — ABNORMAL LOW (ref >39)
LDL Chol Calc (NIH): 78 mg/dL (ref 0–99)
Triglycerides: 161 mg/dL — ABNORMAL HIGH (ref 0–149)
VLDL Cholesterol Cal: 28 mg/dL (ref 5–40)

## 2023-02-06 LAB — HEPATIC FUNCTION PANEL
ALT: 32 IU/L (ref 0–44)
AST: 22 IU/L (ref 0–40)
Albumin: 4.5 g/dL (ref 4.1–5.1)
Alkaline Phosphatase: 87 IU/L (ref 44–121)
Bilirubin Total: 0.6 mg/dL (ref 0.0–1.2)
Bilirubin, Direct: 0.18 mg/dL (ref 0.00–0.40)
Total Protein: 7 g/dL (ref 6.0–8.5)

## 2023-03-19 ENCOUNTER — Ambulatory Visit (HOSPITAL_COMMUNITY): Payer: BC Managed Care – PPO | Attending: Internal Medicine

## 2023-03-19 DIAGNOSIS — I5043 Acute on chronic combined systolic (congestive) and diastolic (congestive) heart failure: Secondary | ICD-10-CM | POA: Insufficient documentation

## 2023-03-19 LAB — ECHOCARDIOGRAM COMPLETE
Area-P 1/2: 3.4 cm2
S' Lateral: 2.7 cm
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
## 2023-03-26 ENCOUNTER — Telehealth: Payer: Self-pay | Admitting: *Deleted

## 2023-03-26 ENCOUNTER — Ambulatory Visit: Payer: BC Managed Care – PPO | Attending: Physician Assistant | Admitting: Physician Assistant

## 2023-03-26 ENCOUNTER — Encounter: Payer: Self-pay | Admitting: Physician Assistant

## 2023-03-26 VITALS — BP 110/74 | HR 81 | Ht 70.0 in | Wt 228.0 lb

## 2023-03-26 DIAGNOSIS — I2510 Atherosclerotic heart disease of native coronary artery without angina pectoris: Secondary | ICD-10-CM | POA: Diagnosis not present

## 2023-03-26 DIAGNOSIS — I5043 Acute on chronic combined systolic (congestive) and diastolic (congestive) heart failure: Secondary | ICD-10-CM

## 2023-03-26 DIAGNOSIS — G4719 Other hypersomnia: Secondary | ICD-10-CM

## 2023-03-26 DIAGNOSIS — R570 Cardiogenic shock: Secondary | ICD-10-CM

## 2023-03-26 MED ORDER — LOSARTAN POTASSIUM 25 MG PO TABS: 12.5000 mg | ORAL_TABLET | Freq: Every day | ORAL | 3 refills | Status: DC

## 2023-03-26 NOTE — Telephone Encounter (Signed)
Christopher [name], [facility] ordered an itamar study for the pt. Pt agreeable to signed waiver and not open the box until he has been called with the PIN#.

## 2023-03-26 NOTE — Patient Instructions (Signed)
Medication Instructions:  1.Decrease losartan to 12.5 mg daily *If you need a refill on your cardiac medications before your next appointment, please call your pharmacy*  Lab Work: None ordered If you have labs (blood work) drawn today and your tests are completely normal, you will receive your results only by: MyChart Message (if you have MyChart) OR A paper copy in the mail If you have any lab test that is abnormal or we need to change your treatment, we will call you to review the results.  Testing/Procedures: Your physician has recommended that you have an [facility] sleep study. This test records several body functions during sleep, including: brain activity, eye movement, oxygen and carbon dioxide blood levels, heart rate and rhythm, breathing rate and rhythm, the flow of air through your mouth and nose, snoring, body muscle movements, and chest and belly movement.   Follow-Up: At [facility], you and your health needs are our priority.  As part of our continuing mission to provide you with exceptional heart care, we have created designated Provider Care Teams.  These Care Teams include your primary Cardiologist (physician) and Advanced Practice Providers (APPs -  Physician Assistants and Nurse Practitioners) who all work together to provide you with the care you need, when you need it.  Your next appointment:   6 month(s)  Provider:   Tonny Bollman, MD    Low-Sodium Eating Plan Salt (sodium) helps you keep a healthy balance of fluids in your body. Too much sodium can raise your blood pressure. It can also cause fluid and waste to be held in your body. Your health care provider or dietitian may recommend a low-sodium eating plan if you have high blood pressure (hypertension), kidney disease, liver disease, or heart failure. Eating less sodium can help lower your blood pressure and reduce swelling. It can also protect your heart, liver, and kidneys. What are tips for following  this plan? Reading food labels  Check food labels for the amount of sodium per serving. If you eat more than one serving, you must multiply the listed amount by the number of servings. Choose foods with less than 140 milligrams (mg) of sodium per serving. Avoid foods with 300 mg of sodium or more per serving. Always check how much sodium is in a product, even if the label says "unsalted" or "no salt added." Shopping  Buy products labeled as "low-sodium" or "no salt added." Buy fresh foods. Avoid canned foods and pre-made or frozen meals. Avoid canned, cured, or processed meats. Buy breads that have less than 80 mg of sodium per slice. Cooking  Eat more home-cooked food. Try to eat less restaurant, buffet, and fast food. Try not to add salt when you cook. Use salt-free seasonings or herbs instead of table salt or sea salt. Check with your provider or pharmacist before using salt substitutes. Cook with plant-based oils, such as canola, sunflower, or olive oil. Meal planning When eating at a restaurant, ask if your food can be made with less salt or no salt. Avoid dishes labeled as brined, pickled, cured, or smoked. Avoid dishes made with soy sauce, miso, or teriyaki sauce. Avoid foods that have monosodium glutamate (MSG) in them. MSG may be added to some restaurant food, sauces, soups, bouillon, and canned foods. Make meals that can be grilled, baked, poached, roasted, or steamed. These are often made with less sodium. General information Try to limit your sodium intake to 1,500-2,300 mg each day, or the amount told by your provider.  What foods should I eat? Fruits Fresh, frozen, or canned fruit. Fruit juice. Vegetables Fresh or frozen vegetables. "No salt added" canned vegetables. "No salt added" tomato sauce and paste. Low-sodium or reduced-sodium tomato and vegetable juice. Grains Low-sodium cereals, such as oats, puffed wheat and rice, and shredded wheat. Low-sodium crackers.  Unsalted rice. Unsalted pasta. Low-sodium bread. Whole grain breads and whole grain pasta. Meats and other proteins Fresh or frozen meat, poultry, seafood, and fish. These should have no added salt. Low-sodium canned tuna and salmon. Unsalted nuts. Dried peas, beans, and lentils without added salt. Unsalted canned beans. Eggs. Unsalted nut butters. Dairy Milk. Soy milk. Cheese that is naturally low in sodium, such as ricotta cheese, fresh mozzarella, or Swiss cheese. Low-sodium or reduced-sodium cheese. Cream cheese. Yogurt. Seasonings and condiments Fresh and dried herbs and spices. Salt-free seasonings. Low-sodium mustard and ketchup. Sodium-free salad dressing. Sodium-free light mayonnaise. Fresh or refrigerated horseradish. Lemon juice. Vinegar. Other foods Homemade, reduced-sodium, or low-sodium soups. Unsalted popcorn and pretzels. Low-salt or salt-free chips. The items listed above may not be all the foods and drinks you can have. Talk to a dietitian to learn more. What foods should I avoid? Vegetables Sauerkraut, pickled vegetables, and relishes. Olives. [place] fries. Onion rings. Regular canned vegetables, except low-sodium or reduced-sodium items. Regular canned tomato sauce and paste. Regular tomato and vegetable juice. Frozen vegetables in sauces. Grains Instant hot cereals. Bread stuffing, pancake, and biscuit mixes. Croutons. Seasoned rice or pasta mixes. Noodle soup cups. Boxed or frozen macaroni and cheese. Regular salted crackers. Self-rising flour. Meats and other proteins Meat or fish that is salted, canned, smoked, spiced, or pickled. Precooked or cured meat, such as sausages or meat loaves. Tomasa Blase. Ham. Pepperoni. Hot dogs. Corned beef. Chipped beef. Salt pork. Jerky. Pickled herring, anchovies, and sardines. Regular canned tuna. Salted nuts. Dairy Processed cheese and cheese spreads. Hard cheeses. Cheese curds. Blue cheese. Feta cheese. String cheese. Regular cottage cheese.  Buttermilk. Canned milk. Fats and oils Salted butter. Regular margarine. Ghee. Bacon fat. Seasonings and condiments Onion salt, garlic salt, seasoned salt, table salt, and sea salt. Canned and packaged gravies. Worcestershire sauce. Tartar sauce. Barbecue sauce. Teriyaki sauce. Soy sauce, including reduced-sodium soy sauce. Steak sauce. Fish sauce. Oyster sauce. Cocktail sauce. Horseradish that you find on the shelf. Regular ketchup and mustard. Meat flavorings and tenderizers. Bouillon cubes. Hot sauce. Pre-made or packaged marinades. Pre-made or packaged taco seasonings. Relishes. Regular salad dressings. Salsa. Other foods Salted popcorn and pretzels. Corn chips and puffs. Potato and tortilla chips. Canned or dried soups. Pizza. Frozen entrees and pot pies. The items listed above may not be all the foods and drinks you should avoid. Talk to a dietitian to learn more. This information is not intended to replace advice given to you by your health care provider. Make sure you discuss any questions you have with your health care provider. Document Revised: 11/05/2022 Document Reviewed: 11/05/2022 Elsevier Patient Education  2024 [employer]  Heart-Healthy Eating Plan Many factors influence your heart health, including eating and exercise habits. Heart health is also called coronary health. Coronary risk increases with abnormal blood fat (lipid) levels. A heart-healthy eating plan includes limiting unhealthy fats, increasing healthy fats, limiting salt (sodium) intake, and making other diet and lifestyle changes. What is my plan? Your health care provider may recommend that: You limit your fat intake to _________% or less of your total calories each day. You limit your saturated fat intake to _________% or less of your total  calories each day. You limit the amount of cholesterol in your diet to less than _________ mg per day. You limit the amount of sodium in your diet to less than _________ mg  per day. What are tips for following this plan? Cooking Cook foods using methods other than frying. Baking, boiling, grilling, and broiling are all good options. Other ways to reduce fat include: Removing the skin from poultry. Removing all visible fats from meats. Steaming vegetables in water or broth. Meal planning  At meals, imagine dividing your plate into fourths: Fill one-half of your plate with vegetables and green salads. Fill one-fourth of your plate with whole grains. Fill one-fourth of your plate with lean protein foods. Eat 2-4 cups of vegetables per day. One cup of vegetables equals 1 cup (91 g) broccoli or cauliflower florets, 2 medium carrots, 1 large bell pepper, 1 large sweet potato, 1 large tomato, 1 medium white potato, 2 cups (150 g) raw leafy greens. Eat 1-2 cups of fruit per day. One cup of fruit equals 1 small apple, 1 large banana, 1 cup (237 g) mixed fruit, 1 large orange,  cup (82 g) dried fruit, 1 cup (240 mL) 100% fruit juice. Eat more foods that contain soluble fiber. Examples include apples, broccoli, carrots, beans, peas, and barley. Aim to get 25-30 g of fiber per day. Increase your consumption of legumes, nuts, and seeds to 4-5 servings per week. One serving of dried beans or legumes equals  cup (90 g) cooked, 1 serving of nuts is  oz (12 almonds, 24 pistachios, or 7 walnut halves), and 1 serving of seeds equals  oz (8 g). Fats Choose healthy fats more often. Choose monounsaturated and polyunsaturated fats, such as olive and canola oils, avocado oil, flaxseeds, walnuts, almonds, and seeds. Eat more omega-3 fats. Choose salmon, mackerel, sardines, tuna, flaxseed oil, and ground flaxseeds. Aim to eat fish at least 2 times each week. Check food labels carefully to identify foods with trans fats or high amounts of saturated fat. Limit saturated fats. These are found in animal products, such as meats, butter, and cream. Plant sources of saturated fats include  palm oil, palm kernel oil, and coconut oil. Avoid foods with partially hydrogenated oils in them. These contain trans fats. Examples are stick margarine, some tub margarines, cookies, crackers, and other baked goods. Avoid fried foods. General information Eat more home-cooked food and less restaurant, buffet, and fast food. Limit or avoid alcohol. Limit foods that are high in added sugar and simple starches such as foods made using white refined flour (white breads, pastries, sweets). Lose weight if you are overweight. Losing just 5-10% of your body weight can help your overall health and prevent diseases such as diabetes and heart disease. Monitor your sodium intake, especially if you have high blood pressure. Talk with your health care provider about your sodium intake. Try to incorporate more vegetarian meals weekly. What foods should I eat? Fruits All fresh, canned (in natural juice), or frozen fruits. Vegetables Fresh or frozen vegetables (raw, steamed, roasted, or grilled). Green salads. Grains Most grains. Choose whole wheat and whole grains most of the time. Rice and pasta, including brown rice and pastas made with whole wheat. Meats and other proteins Lean, well-trimmed beef, veal, pork, and lamb. Chicken and [place] without skin. All fish and shellfish. Wild duck, rabbit, pheasant, and venison. Egg whites or low-cholesterol egg substitutes. Dried beans, peas, lentils, and tofu. Seeds and most nuts. Dairy Low-fat or nonfat cheeses, including ricotta  and mozzarella. Skim or 1% milk (liquid, powdered, or evaporated). Buttermilk made with low-fat milk. Nonfat or low-fat yogurt. Fats and oils Non-hydrogenated (trans-free) margarines. Vegetable oils, including soybean, sesame, sunflower, olive, avocado, peanut, safflower, corn, canola, and cottonseed. Salad dressings or mayonnaise made with a vegetable oil. Beverages Water (mineral or sparkling). Coffee and tea. Unsweetened ice tea. Diet  beverages. Sweets and desserts Sherbet, gelatin, and fruit ice. Small amounts of dark chocolate. Limit all sweets and desserts. Seasonings and condiments All seasonings and condiments. The items listed above may not be a complete list of foods and beverages you can eat. Contact a dietitian for more options. What foods should I avoid? Fruits Canned fruit in heavy syrup. Fruit in cream or butter sauce. Fried fruit. Limit coconut. Vegetables Vegetables cooked in cheese, cream, or butter sauce. Fried vegetables. Grains Breads made with saturated or trans fats, oils, or whole milk. Croissants. Sweet rolls. Donuts. High-fat crackers, such as cheese crackers and chips. Meats and other proteins Fatty meats, such as hot dogs, ribs, sausage, bacon, rib-eye roast or steak. High-fat deli meats, such as salami and bologna. Caviar. Domestic duck and goose. Organ meats, such as liver. Dairy Cream, sour cream, cream cheese, and creamed cottage cheese. Whole-milk cheeses. Whole or 2% milk (liquid, evaporated, or condensed). Whole buttermilk. Cream sauce or high-fat cheese sauce. Whole-milk yogurt. Fats and oils Meat fat, or shortening. Cocoa butter, hydrogenated oils, palm oil, coconut oil, palm kernel oil. Solid fats and shortenings, including bacon fat, salt pork, lard, and butter. Nondairy cream substitutes. Salad dressings with cheese or sour cream. Beverages Regular sodas and any drinks with added sugar. Sweets and desserts Frosting. Pudding. Cookies. Cakes. Pies. Milk chocolate or white chocolate. Buttered syrups. Full-fat ice cream or ice cream drinks. The items listed above may not be a complete list of foods and beverages to avoid. Contact a dietitian for more information. Summary Heart-healthy meal planning includes limiting unhealthy fats, increasing healthy fats, limiting salt (sodium) intake and making other diet and lifestyle changes. Lose weight if you are overweight. Losing just 5-10% of  your body weight can help your overall health and prevent diseases such as diabetes and heart disease. Focus on eating a balance of foods, including fruits and vegetables, low-fat or nonfat dairy, lean protein, nuts and legumes, whole grains, and heart-healthy oils and fats. This information is not intended to replace advice given to you by your health care provider. Make sure you discuss any questions you have with your health care provider. Document Revised: 11/24/2021 Document Reviewed: 11/24/2021 Elsevier Patient Education  2024 [employer].

## 2023-04-02 NOTE — Telephone Encounter (Signed)
Left message for the pt that sleep study has been approved. I left PIN# [id number] on vm as well asking if pt would be able to complete study between tonight or by early next week. Left message to call back or send my chart msg that he received my call and got the PIN#.

## 2023-04-02 NOTE — Telephone Encounter (Signed)
Prior Authorization for ITAMAR sent to [employer] via web portal. Tracking Number .  NO PA REQ 

## 2023-04-19 ENCOUNTER — Encounter: Payer: Self-pay | Admitting: *Deleted

## 2023-04-19 NOTE — Telephone Encounter (Signed)
I have tried to reach the pt again today and still cannot leave a message on his vm. I do not see a DPR on file. I have sent MY CHART messages. I will mail a letter in the mail to the pt to that we have tried to reach him about the sleep study. See notes.

## 2023-04-20 NOTE — Telephone Encounter (Signed)
Pt would like [place] to call him on [phone] when results are in.

## 2023-04-20 NOTE — Telephone Encounter (Signed)
I was able to s/w the pt today and he has been given the PIN# [id number] to proceed with sleep study. Pt states he will do sleep study one night this week. I had stated to the pt that I had mailed out a letter yesterday to him to call the office in regard to the sleep study being approved. I informed the pt that he can disregard that letter. Pt said thank you for the help.   Called and made the patient aware that he may proceed with the [facility] Sleep Study. PIN # provided to the patient. Patient made aware that he will be contacted after the test has been read with the results and any recommendations. Patient verbalized understanding and thanked me for the call.

## 2023-04-20 NOTE — Telephone Encounter (Signed)
  Pt is returning call, pt said, she work at night the reason why he can answer his phone during the day. He said, he doesn't have access to his mychart but can leave a detailed message on his VM

## 2023-07-16 ENCOUNTER — Encounter: Payer: Self-pay | Admitting: Cardiovascular Disease

## 2023-10-08 ENCOUNTER — Ambulatory Visit: Payer: BC Managed Care – PPO | Admitting: Cardiovascular Disease

## 2023-11-01 DIAGNOSIS — R059 Cough, unspecified: Secondary | ICD-10-CM | POA: Diagnosis not present

## 2023-11-01 DIAGNOSIS — R110 Nausea: Secondary | ICD-10-CM | POA: Diagnosis not present

## 2023-11-01 DIAGNOSIS — D696 Thrombocytopenia, unspecified: Secondary | ICD-10-CM | POA: Diagnosis not present

## 2023-11-01 DIAGNOSIS — J101 Influenza due to other identified influenza virus with other respiratory manifestations: Secondary | ICD-10-CM | POA: Diagnosis not present

## 2023-11-11 DIAGNOSIS — Z125 Encounter for screening for malignant neoplasm of prostate: Secondary | ICD-10-CM | POA: Diagnosis not present

## 2023-11-11 DIAGNOSIS — Z79899 Other long term (current) drug therapy: Secondary | ICD-10-CM | POA: Diagnosis not present

## 2023-11-11 DIAGNOSIS — D696 Thrombocytopenia, unspecified: Secondary | ICD-10-CM | POA: Diagnosis not present

## 2023-11-11 DIAGNOSIS — I10 Essential (primary) hypertension: Secondary | ICD-10-CM | POA: Diagnosis not present

## 2023-11-11 DIAGNOSIS — E781 Pure hyperglyceridemia: Secondary | ICD-10-CM | POA: Diagnosis not present

## 2023-11-11 DIAGNOSIS — R946 Abnormal results of thyroid function studies: Secondary | ICD-10-CM | POA: Diagnosis not present

## 2023-11-11 DIAGNOSIS — E1159 Type 2 diabetes mellitus with other circulatory complications: Secondary | ICD-10-CM | POA: Diagnosis not present

## 2023-11-17 DIAGNOSIS — Z0000 Encounter for general adult medical examination without abnormal findings: Secondary | ICD-10-CM | POA: Diagnosis not present

## 2023-11-18 ENCOUNTER — Ambulatory Visit: Payer: BC Managed Care – PPO | Attending: Cardiovascular Disease | Admitting: Cardiovascular Disease

## 2023-11-18 ENCOUNTER — Encounter: Payer: Self-pay | Admitting: Cardiovascular Disease

## 2023-11-18 VITALS — BP 130/100 | HR 92 | Ht 70.0 in | Wt 233.4 lb

## 2023-11-18 DIAGNOSIS — I2510 Atherosclerotic heart disease of native coronary artery without angina pectoris: Secondary | ICD-10-CM

## 2023-11-18 DIAGNOSIS — E782 Mixed hyperlipidemia: Secondary | ICD-10-CM | POA: Diagnosis not present

## 2023-11-18 DIAGNOSIS — I10 Essential (primary) hypertension: Secondary | ICD-10-CM | POA: Diagnosis not present

## 2023-11-18 MED ORDER — METOPROLOL SUCCINATE ER 50 MG PO TB24: 50.0000 mg | ORAL_TABLET | Freq: Every day | ORAL | 3 refills | Status: AC

## 2023-11-18 MED ORDER — LOSARTAN POTASSIUM 50 MG PO TABS: 50.0000 mg | ORAL_TABLET | Freq: Every day | ORAL | 3 refills | Status: AC

## 2023-11-18 MED ORDER — PANTOPRAZOLE SODIUM 40 MG PO TBEC: 40.0000 mg | DELAYED_RELEASE_TABLET | Freq: Every day | ORAL | 3 refills | Status: AC

## 2023-11-18 MED ORDER — CLOPIDOGREL BISULFATE 75 MG PO TABS: 75.0000 mg | ORAL_TABLET | Freq: Every day | ORAL | 3 refills | Status: AC

## 2023-11-18 NOTE — Progress Notes (Signed)
Cardiology Office Note:    Date:  11/18/2023   ID:  Christopher [name], DOB 08/10/1971, MRN [id number]  PCP:  Irena Reichmann, DO   [facility] Providers Cardiologist:  Tonny Bollman, MD     Referring MD: Irena Reichmann, DO   Chief Complaint  Patient presents with   Coronary Artery Disease    History of Present Illness:    Christopher [name] is a 53 y.o. male presenting for follow-up of coronary artery disease.  The patient initially presented in February 2024 with acute anterolateral STEMI complicated by ventricular fibrillation arrest (occurred in emergency department).  He underwent emergency cardiac catheterization with primary PCI of the LAD and necessitated intra-aortic balloon pump placement for support.  He has been managed on aspirin and ticagrelor as well as a high intensity statin drug.  LVEF was mildly reduced at 50 to 55%.  Follow-up echo in May 2024 showed improvement in LVEF to 55 to 60% with no regional wall motion abnormalities, normal RV function, and no significant valvular disease.  Comorbid conditions have included polysubstance use (alcohol, tobacco, cocaine), mild thrombocytopenia.  He brings in labs from 11/11/2023 with a cholesterol of 214, triglycerides 373, LDL 111, HDL 39.  Kidney function is normal with creatinine 1.01, potassium is 4.2, liver functions normal with AST 13 ALT 23 bilirubin 0.3.  Hemoglobin A1c is 7.6.  The patient is getting over influenza.  He is feeling better now and denies any symptoms of substernal chest pain or pressure.  He has had no heart palpitations, edema, orthopnea, or PND.  He tells me today that he has not smoked in many years.  He is drinking on occasion but not to excess.  He no longer is using any illicit drugs.  He has not been compliant with his medications.  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  He is not taking any of his twice daily medicines as scheduled.  He feels like he is taking  most of his medicines about 2 days/week.  He can no longer afford ticagrelor as the cost is stopped to $600.  Current Medications: Current Meds  Medication Sig   albuterol (PROVENTIL HFA;VENTOLIN HFA) 108 (90 BASE) MCG/ACT inhaler Inhale 2 puffs into the lungs every 6 (six) hours as needed. For shortness of breath   aspirin EC 81 MG tablet Take 1 tablet (81 mg total) by mouth daily. Swallow whole.   atorvastatin (LIPITOR) 80 MG tablet Take 1 tablet (80 mg total) by mouth daily.   cetirizine (ZYRTEC) 10 MG tablet Take 10 mg by mouth daily as needed.    clopidogrel (PLAVIX) 75 MG tablet Take 1 tablet (75 mg total) by mouth daily.   dapagliflozin propanediol (FARXIGA) 10 MG TABS tablet Take 1 tablet (10 mg total) by mouth daily.   divalproex (DEPAKOTE) 250 MG DR tablet Take 1,000 mg by mouth at bedtime.   gabapentin (NEURONTIN) 100 MG capsule Take 100 mg by mouth as needed.   losartan (COZAAR) 50 MG tablet Take 1 tablet (50 mg total) by mouth daily.   metoprolol succinate (TOPROL-XL) 50 MG 24 hr tablet Take 1 tablet (50 mg total) by mouth daily. Take with or immediately following a meal.   Multiple Vitamins-Minerals (MULTIVITAMIN ADULT PO) Take by mouth.   nitroGLYCERIN (NITROSTAT) 0.4 MG SL tablet Place 1 tablet (0.4 mg total) under the tongue every 5 (five) minutes x 3 doses as needed for chest pain.   pantoprazole (PROTONIX) 40 MG  tablet Take 1 tablet (40 mg total) by mouth daily.   spironolactone (ALDACTONE) 25 MG tablet Take 1 tablet (25 mg total) by mouth daily.   thiamine (VITAMIN B1) 100 MG tablet Take 1 tablet (100 mg total) by mouth daily.   traZODone (DESYREL) 50 MG tablet Take 50 mg by mouth as needed for sleep.   venlafaxine XR (EFFEXOR-XR) 75 MG 24 hr capsule Take 75 mg by mouth daily.   [DISCONTINUED] carvedilol (COREG) 6.25 MG tablet Take 1 tablet (6.25 mg total) by mouth 2 (two) times daily.   [DISCONTINUED] folic acid (FOLVITE) 1 MG tablet Take 1 tablet (1 mg total) by mouth  daily.   [DISCONTINUED] losartan (COZAAR) 25 MG tablet Take 0.5 tablets (12.5 mg total) by mouth daily.   [DISCONTINUED] omeprazole (PRILOSEC) 20 MG capsule Take 20 mg by mouth as needed.   [DISCONTINUED] ticagrelor (BRILINTA) 90 MG TABS tablet Take 1 tablet (90 mg total) by mouth 2 (two) times daily.     Allergies:   Patient has no known allergies.   ROS:   Please see the history of present illness.    All other systems reviewed and are negative.  EKGs/Labs/Other Studies Reviewed:    The following studies were reviewed today: Cardiac Studies & Procedures   CARDIAC CATHETERIZATION  CARDIAC CATHETERIZATION 12/12/2022  Narrative 1.  Acute anterior STEMI complicated by ventricular fibrillation and subsequent ventricular tachycardia 2.  Total occlusion of the proximal LAD, treated successfully with primary PCI using a 3.5 x 24 mm Synergy DES 3.  Mild nonobstructive plaquing in the left main, left circumflex, and mid RCA.  Significant stenosis in the distal circumflex, but this is a small caliber vessel appropriate for medical therapy. 4.  Acute systolic heart failure with severely elevated LVEDP.  Intra-aortic balloon pump counterpulsation initiated prior to reperfusion therapy with PCI 5.  Cardiogenic shock requiring low-dose norepinephrine infusion.  Improvement in LVEDP demonstrated by repeat LVEDP measurement [name] PCI and [name] IABP placement (LVEDP 35 mmHg initially, 26 mmHg at procedure completion).  Recommendations: Aggrastat turned off and Cath Lab Aspirin and ticagrelor without interruption x 12 months Aggressive medical therapy for residual CAD 2D echo to assess degree of LV dysfunction Continue intra-aortic balloon pump 1: 1 augmentation, possible early wean pending further clinical improvement Wean off norepinephrine as tolerated Diurese as tolerated  Findings Coronary Findings Diagnostic  Dominance: Right  Left Main There is mild diffuse disease throughout the vessel.  The left main has mild diffuse plaquing without high-grade stenosis  Left Anterior Descending Prox LAD lesion is 100% stenosed. Vessel is the culprit lesion. The lesion is heavily thrombotic.  Left Circumflex There is mild diffuse disease throughout the vessel. Dist Cx lesion is 70% stenosed. Distal circumflex lesion, supplying a distal posterolateral branch  First Obtuse Marginal Branch There is mild disease in the vessel.  Right Coronary Artery Vessel is large. There is mild diffuse disease throughout the vessel. Large, dominant vessel.  The vessel gives off a PDA branch and a large posterolateral branch.  There is 40 to 50% mid vessel stenosis in the mid RCA, but no high-grade stenosis is identified. Mid RCA lesion is 40% stenosed.  Intervention  Prox LAD lesion Stent CATH LAUNCHER 6FR EBU3.5 guide catheter was inserted. Lesion crossed with guidewire using a WIRE COUGAR XT STRL 190CM. Pre-stent angioplasty was performed using a BALLN EMERGE MR 2.5X15. Maximum pressure:  8 atm. A drug-eluting stent was successfully placed using a SYNERGY XD 3.50X24. Maximum pressure: 14 atm. [name]-Intervention  Lesion Assessment The intervention was successful. Pre-interventional TIMI flow is 0. [name]-intervention TIMI flow is 3. No complications occurred at this lesion. There is a 0% residual stenosis [name] intervention.    ECHOCARDIOGRAM  ECHOCARDIOGRAM COMPLETE 03/19/2023  Narrative ECHOCARDIOGRAM REPORT    Patient Name:   Christopher [name] Date of Exam: 03/19/2023 Medical Rec #:  [id number]        Height:       70.0 in Accession #:    [id number]       Weight:       237.0 lb Date of Birth:  02/05/1971        BSA:          2.243 m Patient Age:    52 years         BP:           142/88 mmHg Patient Gender: M                HR:           84 bpm. Exam Location:  [address]  Procedure: 2D Echo, 3D Echo, Cardiac Doppler, Color Doppler and Strain Analysis  Indications:    I50.21 Acute systolic  (congestive) heart failure  History:        Patient has prior history of Echocardiogram examinations, most recent 12/13/2022. CAD and Previous Myocardial Infarction, TIA, Signs/Symptoms:Chest Pain; Risk Factors:Hypertension. V-fib arrest.  Sonographer:    Cathie Beams RCS Referring Phys: Irena Reichmann  IMPRESSIONS   1. Left ventricular ejection fraction, by estimation, is 55 to 60%. The left ventricle has normal function. The left ventricle has no regional wall motion abnormalities. There is mild concentric left ventricular hypertrophy. GLS -17.3%. 2. Right ventricular systolic function is normal. The right ventricular size is normal. 3. The mitral valve is normal in structure. No evidence of mitral valve regurgitation. No evidence of mitral stenosis. 4. The aortic valve is normal in structure. Aortic valve regurgitation is not visualized. No aortic stenosis is present. 5. The inferior vena cava is normal in size with greater than 50% respiratory variability, suggesting right atrial pressure of 3 mmHg.  FINDINGS Left Ventricle: Left ventricular ejection fraction, by estimation, is 55 to 60%. The left ventricle has normal function. The left ventricle has no regional wall motion abnormalities. The left ventricular internal cavity size was normal in size. There is mild concentric left ventricular hypertrophy. Left ventricular diastolic parameters were normal.  Right Ventricle: The right ventricular size is normal. No increase in right ventricular wall thickness. Right ventricular systolic function is normal.  Left Atrium: Left atrial size was normal in size.  Right Atrium: Right atrial size was normal in size.  Pericardium: There is no evidence of pericardial effusion.  Mitral Valve: The mitral valve is normal in structure. No evidence of mitral valve regurgitation. No evidence of mitral valve stenosis.  Tricuspid Valve: The tricuspid valve is normal in structure. Tricuspid valve  regurgitation is not demonstrated. No evidence of tricuspid stenosis.  Aortic Valve: The aortic valve is normal in structure. Aortic valve regurgitation is not visualized. No aortic stenosis is present.  Pulmonic Valve: The pulmonic valve was normal in structure. Pulmonic valve regurgitation is not visualized. No evidence of pulmonic stenosis.  Aorta: The aortic root is normal in size and structure.  Venous: The inferior vena cava is normal in size with greater than 50% respiratory variability, suggesting right atrial pressure of 3 mmHg.  IAS/Shunts: No atrial level shunt detected by color flow Doppler.  LEFT VENTRICLE PLAX 2D LVIDd:         4.10 cm   Diastology LVIDs:         2.70 cm   LV e' medial:    8.05 cm/s LV PW:         1.20 cm   LV E/e' medial:  9.1 LV IVS:        1.20 cm   LV e' lateral:   11.40 cm/s LVOT diam:     2.10 cm   LV E/e' lateral: 6.4 LV SV:         71 LV SV Index:   32 LVOT Area:     3.46 cm  3D Volume EF: 3D EF:        63 % LV EDV:       119 ml LV ESV:       44 ml LV SV:        75 ml  RIGHT VENTRICLE RV Basal diam:  2.90 cm RV S prime:     10.40 cm/s TAPSE (M-mode): 1.6 cm  LEFT ATRIUM             Index        RIGHT ATRIUM           Index LA diam:        3.50 cm 1.56 cm/m   RA Area:     13.40 cm LA Vol (A2C):   45.5 ml 20.28 ml/m  RA Volume:   30.70 ml  13.68 ml/m LA Vol (A4C):   35.1 ml 15.65 ml/m LA Biplane Vol: 42.1 ml 18.77 ml/m AORTIC VALVE LVOT Vmax:   109.00 cm/s LVOT Vmean:  66.400 cm/s LVOT VTI:    0.205 m  AORTA Ao Root diam: 3.40 cm Ao Asc diam:  3.10 cm  MITRAL VALVE MV Area (PHT): 3.40 cm    SHUNTS MV Decel Time: 223 msec    Systemic VTI:  0.20 m MV E velocity: 73.40 cm/s  Systemic Diam: 2.10 cm MV A velocity: 70.70 cm/s MV E/A ratio:  1.04  Aditya Sabharwal Electronically signed by Dorthula Nettles Signature Date/Time: 03/19/2023/5:26:07 PM    Final    CT SCANS  CT CORONARY MORPH W/CTA COR W/SCORE  04/23/2011  Addendum 04/23/2011  1:00 PM ADDENDUM CREATED: 04/23/2011 12:58:03  I should have mentioned in the Impression that there is evidence of left ventricular hypertrophy, consistent with the history of hypertension.  END ADDENDUM SIGNED BY: Arnell Sieving, M.D.  Narrative *RADIOLOGY REPORT*  INDICATION:  Mid chest pressure over the past 4 days.  Shortness of breath.  Hypertension and elevated cardiac enzymes during evaluation in the emergency department.  CT ANGIOGRAPHY OF THE HEART (CORONARY ARTERIES), STRUCTURE, AND MORPHOLOGY  CONTRAST:  80 ml Omnipaque 350 IV.  COMPARISON:  None.  TECHNIQUE:  CT angiography of the coronary vessels was performed on a 256 channel system using prospective ECG gating.  A scout and noncontrast exam (for calcium scoring) were performed.  Circulation time was measured using a test bolus.  Coronary CTA was performed with sub mm slice collimation during portions of the cardiac cycle (73%, 78%, and 83%) during injection of iodinated contrast. Imaging [name] processing was performed on an independent workstation creating multiplanar and 3-D images, and quantitative analysis of the heart and coronary arteries.  Note that this exam targets the heart and the chest was not imaged in its entirety.  PREMEDICATION: Lopressor 50 mg, P.O. Lopressor 0 mg, IV Nitroglycerin 0.4 mcg,  sublingual.  FINDINGS: Technical quality:  Very good  Heart rate:  64-66  CORONARY ARTERIES: Left main coronary artery:  Normal.  Left anterior descending:  Tiny calcified plaque in the mid LAD just proximal to the origin of a small first diagonal, nonobstructive (no visible stenosis).  3 visible diagonal branches.  Left circumflex:  Normal.  2 visible obtuse marginal branches.  Right coronary artery:  Normal.  Posterior descending artery:  Normal.  Dominance:  Right.  CORONARY CALCIUM: Total Agatston Score:  2 MESA database percentile:  83rd  CARDIAC  MEASUREMENTS: Interventricular septum (6 - 12 mm):  15 mm  LV posterior wall (6 - 12 mm):  17 mm LV diameter in diastole (35 - 52 mm):  39 mm  AORTA AND PULMONARY MEASUREMENTS: Aortic root (21 - 40 mm): 29 mm  at the annulus 36 mm  at the sinuses of Valsalva 24 mm  at the sinotubular junction Ascending aorta (<  40 mm):  30 mm Descending aorta (<  40 mm):  21 mm Main pulmonary artery:  (<  30 mm):  24 mm  EXTRACARDIAC FINDINGS: Visualized pulmonary parenchyma clear apart from minimal pleural scarring at the right base.  Visualized mediastinum unremarkable. Visualized extreme upper abdomen unremarkable.  IMPRESSION:  1.  Minimal nonobstructive coronary artery disease.  The patient's total coronary artery calcium score is 2, which is 83rd percentile for patient's matched age and gender. 2.  Tiny calcified plaque involving the mid LAD without stenosis. 3.  Right coronary artery dominance.  Report was called to CDU mid level, Ebbie [place], [place], at [phone] at 1225 hours on 04/23/2011.  Original Report Authenticated By: Arnell Sieving, M.D.          EKG:        Recent Labs: 12/12/2022: Magnesium 2.5 12/15/2022: BUN 11; Creatinine, Ser 0.98; Hemoglobin 16.0; Platelets 94; Potassium 4.0; Sodium 138 02/05/2023: ALT 32  Recent Lipid Panel    Component Value Date/Time   CHOL 141 02/05/2023 0843   TRIG 161 (H) 02/05/2023 0843   HDL 35 (L) 02/05/2023 0843   CHOLHDL 4.0 02/05/2023 0843   CHOLHDL 5.4 12/12/2022 1538   VLDL 26 12/12/2022 1538   LDLCALC 78 02/05/2023 0843      Physical Exam:    VS:  BP (!) 130/100 Comment: Right arm 120/100  Pulse 92   Ht 5\' 10"  (1.778 m)   Wt 233 lb 6.4 oz (105.9 kg)   SpO2 96%   BMI 33.49 kg/m     Wt Readings from Last 3 Encounters:  11/18/23 233 lb 6.4 oz (105.9 kg)  03/26/23 228 lb (103.4 kg)  12/23/22 237 lb (107.5 kg)     GEN:  Well nourished, well developed in no acute distress HEENT: Normal NECK: No JVD; No carotid  bruits LYMPHATICS: No lymphadenopathy CARDIAC: RRR, no murmurs, rubs, gallops RESPIRATORY:  Clear to auscultation without rales, wheezing or rhonchi  ABDOMEN: Soft, non-tender, non-distended MUSCULOSKELETAL:  No edema; No deformity  SKIN: Warm and dry NEUROLOGIC:  Alert and oriented x 3 PSYCHIATRIC:  Normal affect   Assessment & Plan Coronary artery disease involving native coronary artery of native heart without angina pectoris Patient still within 12 months of his STEMI.  Will change him from ticagrelor to clopidogrel 75 mg daily.  Continue aspirin 81 mg daily.  Continue risk reduction measures as above.  Emphasized the importance of medication adherence. Mixed hyperlipidemia Treated with atorvastatin.  Lipids above goal but noncompliant with medication.  Emphasize the  importance of taking this on a daily basis.  Continue follow-up with PCP. Essential hypertension Blood pressure goal with medication noncompliance.  Plan: Increase losartan to 50 mg daily, continue spironolactone, change twice daily carvedilol to once daily metoprolol succinate 50 mg.  Focus today on stressing medication adherence and reviewing his medications in an attempt to get him on once daily generic medicines.  See above for discussion.  Today, ticagrelor was discontinued and clopidogrel 75 mg daily was started.  Losartan was increased to 50 mg daily.  Carvedilol was changed to metoprolol succinate 50 mg daily.          Medication Adjustments/Labs and Tests Ordered: Current medicines are reviewed at length with the patient today.  Concerns regarding medicines are outlined above.  No orders of the defined types were placed in this encounter.  Meds ordered this encounter  Medications   metoprolol succinate (TOPROL-XL) 50 MG 24 hr tablet    Sig: Take 1 tablet (50 mg total) by mouth daily. Take with or immediately following a meal.    Dispense:  90 tablet    Refill:  3    Replaces Coreg   clopidogrel (PLAVIX) 75  MG tablet    Sig: Take 1 tablet (75 mg total) by mouth daily.    Dispense:  90 tablet    Refill:  3    Replaces Brilinta   losartan (COZAAR) 50 MG tablet    Sig: Take 1 tablet (50 mg total) by mouth daily.    Dispense:  90 tablet    Refill:  3    Dose INCREASE   pantoprazole (PROTONIX) 40 MG tablet    Sig: Take 1 tablet (40 mg total) by mouth daily.    Dispense:  90 tablet    Refill:  3    Replaces Prilosec    Patient Instructions  Medication Instructions:  INCREASE Losartan to 50mg  daily STOP Brilinta START Clopidogrel/Plavix 75mg  daily STOP Carvedilol/Coreg START Metoprolol Succinate/Toprol XL 50mg  daily STOP Omeprazole START Pantoprazole/Protonix 40mg  daily *If you need a refill on your cardiac medications before your next appointment, please call your pharmacy*  Follow-Up: At [facility], you and your health needs are our priority.  As part of our continuing mission to provide you with exceptional heart care, we have created designated Provider Care Teams.  These Care Teams include your primary Cardiologist (physician) and Advanced Practice Providers (APPs -  Physician Assistants and Nurse Practitioners) who all work together to provide you with the care you need, when you need it.  Your next appointment:   6 month(s)  Provider:   APP     1st Floor: - Lobby - Registration  - Pharmacy  - Lab - Cafe  2nd Floor: - PV Lab - Diagnostic Testing (echo, CT, nuclear med)  3rd Floor: - Vacant  4th Floor: - TCTS (cardiothoracic surgery) - AFib Clinic - Structural Heart Clinic - Vascular Surgery  - Vascular Ultrasound  5th Floor: - HeartCare Cardiology (general and EP) - Clinical Pharmacy for coumadin, hypertension, lipid, weight-loss medications, and med management appointments    Valet parking services will be available as well.        Signed, Tonny Bollman, MD  11/18/2023 4:46 PM    [facility]

## 2023-11-18 NOTE — Patient Instructions (Addendum)
Medication Instructions:  INCREASE Losartan to 50mg  daily STOP Brilinta START Clopidogrel/Plavix 75mg  daily STOP Carvedilol/Coreg START Metoprolol Succinate/Toprol XL 50mg  daily STOP Omeprazole START Pantoprazole/Protonix 40mg  daily *If you need a refill on your cardiac medications before your next appointment, please call your pharmacy*  Follow-Up: At [facility], you and your health needs are our priority.  As part of our continuing mission to provide you with exceptional heart care, we have created designated Provider Care Teams.  These Care Teams include your primary Cardiologist (physician) and Advanced Practice Providers (APPs -  Physician Assistants and Nurse Practitioners) who all work together to provide you with the care you need, when you need it.  Your next appointment:   6 month(s)  Provider:   APP     1st Floor: - Lobby - Registration  - Pharmacy  - Lab - Cafe  2nd Floor: - PV Lab - Diagnostic Testing (echo, CT, nuclear med)  3rd Floor: - Vacant  4th Floor: - TCTS (cardiothoracic surgery) - AFib Clinic - Structural Heart Clinic - Vascular Surgery  - Vascular Ultrasound  5th Floor: - HeartCare Cardiology (general and EP) - Clinical Pharmacy for coumadin, hypertension, lipid, weight-loss medications, and med management appointments    Valet parking services will be available as well.

## 2023-11-18 NOTE — Telephone Encounter (Signed)
Pt came in the office today to see Dr. Excell Seltzer. Dema Severin RN for Dr. Excell Seltzer brought Itamar device back to me, that the pt was originally ordered 03/26/23. Pt said he is not going to do it, that it will cost him too much money.   Box seems to not have been opened. I will und-register the device today and place device back into inventory.   I will also send this note to Maralyn Sago, RN as Lorain Childes and/or further notation needed.

## 2024-03-31 ENCOUNTER — Other Ambulatory Visit: Payer: Self-pay | Admitting: Physician Assistant

## 2024-04-01 ENCOUNTER — Other Ambulatory Visit: Payer: Self-pay | Admitting: Physician Assistant

## 2024-05-11 ENCOUNTER — Other Ambulatory Visit: Payer: Self-pay

## 2024-05-11 ENCOUNTER — Emergency Department (HOSPITAL_COMMUNITY)
Admission: EM | Admit: 2024-05-11 | Discharge: 2024-05-12 | Disposition: A | Discharge status: Home / Self Care | Payer: MEDICAID | Attending: Emergency Medicine | Admitting: Emergency Medicine

## 2024-05-11 ENCOUNTER — Encounter (HOSPITAL_COMMUNITY): Payer: Self-pay | Admitting: Emergency Medicine

## 2024-05-11 ENCOUNTER — Emergency Department (HOSPITAL_COMMUNITY): Payer: MEDICAID

## 2024-05-11 DIAGNOSIS — R739 Hyperglycemia, unspecified: Secondary | ICD-10-CM

## 2024-05-11 DIAGNOSIS — E119 Type 2 diabetes mellitus without complications: Secondary | ICD-10-CM

## 2024-05-11 DIAGNOSIS — R079 Chest pain, unspecified: Secondary | ICD-10-CM | POA: Diagnosis not present

## 2024-05-11 DIAGNOSIS — I10 Essential (primary) hypertension: Secondary | ICD-10-CM | POA: Insufficient documentation

## 2024-05-11 DIAGNOSIS — E1165 Type 2 diabetes mellitus with hyperglycemia: Secondary | ICD-10-CM | POA: Insufficient documentation

## 2024-05-11 DIAGNOSIS — Z79899 Other long term (current) drug therapy: Secondary | ICD-10-CM | POA: Insufficient documentation

## 2024-05-11 DIAGNOSIS — Z7901 Long term (current) use of anticoagulants: Secondary | ICD-10-CM | POA: Insufficient documentation

## 2024-05-11 DIAGNOSIS — Z7982 Long term (current) use of aspirin: Secondary | ICD-10-CM | POA: Insufficient documentation

## 2024-05-11 DIAGNOSIS — I2510 Atherosclerotic heart disease of native coronary artery without angina pectoris: Secondary | ICD-10-CM | POA: Insufficient documentation

## 2024-05-11 LAB — TROPONIN I (HIGH SENSITIVITY): Troponin I (High Sensitivity): 5 ng/L (ref <18)

## 2024-05-11 LAB — CBG MONITORING, ED: Glucose-Capillary: 427 mg/dL — ABNORMAL HIGH (ref 70–99)

## 2024-05-11 NOTE — ED Notes (Signed)
 EKG completed in triage by triage nurse. EDP notified and given paper copy.

## 2024-05-11 NOTE — ED Triage Notes (Signed)
 Patient c/o chest pain and sob after eating a dorito this evening.  Patient also endorses blurred vision and constipation x 2 months.  Patient also states that he smells a weird gas smell.  Patient's visitor gave him a nitroglycerin  after he had a BM before he came here.  Patient gives verbal consent for MSE.

## 2024-05-12 LAB — CBC
HCT: 49.8 % (ref 39.0–52.0)
Hemoglobin: 18.5 g/dL — ABNORMAL HIGH (ref 13.0–17.0)
MCH: 30.9 pg (ref 26.0–34.0)
MCHC: 37.1 g/dL — ABNORMAL HIGH (ref 30.0–36.0)
MCV: 83.3 fL (ref 80.0–100.0)
Platelets: 159 K/uL (ref 150–400)
RBC: 5.98 MIL/uL — ABNORMAL HIGH (ref 4.22–5.81)
RDW: 11.9 % (ref 11.5–15.5)
WBC: 10.2 K/uL (ref 4.0–10.5)
nRBC: 0.2 % (ref 0.0–0.2)

## 2024-05-12 LAB — BASIC METABOLIC PANEL WITH GFR
Anion gap: 15 (ref 5–15)
BUN: 11 mg/dL (ref 6–20)
CO2: 20 mmol/L — ABNORMAL LOW (ref 22–32)
Calcium: 8.5 mg/dL — ABNORMAL LOW (ref 8.9–10.3)
Chloride: 96 mmol/L — ABNORMAL LOW (ref 98–111)
Creatinine, Ser: 0.76 mg/dL (ref 0.61–1.24)
GFR, Estimated: >60 mL/min (ref >60)
Glucose, Bld: 337 mg/dL — ABNORMAL HIGH (ref 70–99)
Potassium: 4.1 mmol/L (ref 3.5–5.1)
Sodium: 131 mmol/L — ABNORMAL LOW (ref 135–145)

## 2024-05-12 LAB — URINALYSIS, ROUTINE W REFLEX MICROSCOPIC
Bacteria, UA: NONE SEEN
Bilirubin Urine: NEGATIVE
Glucose, UA: >=500 mg/dL — AB
Hgb urine dipstick: NEGATIVE
Ketones, ur: 20 mg/dL — AB
Leukocytes,Ua: NEGATIVE
Nitrite: NEGATIVE
Protein, ur: NEGATIVE mg/dL
Specific Gravity, Urine: 1.036 — ABNORMAL HIGH (ref 1.005–1.030)
pH: 5 (ref 5.0–8.0)

## 2024-05-12 LAB — HEPATIC FUNCTION PANEL
ALT: 31 U/L (ref 0–44)
AST: 27 U/L (ref 15–41)
Albumin: 3.6 g/dL (ref 3.5–5.0)
Alkaline Phosphatase: 73 U/L (ref 38–126)
Bilirubin, Direct: 0.3 mg/dL — ABNORMAL HIGH (ref 0.0–0.2)
Indirect Bilirubin: 1.1 mg/dL — ABNORMAL HIGH (ref 0.3–0.9)
Total Bilirubin: 1.4 mg/dL — ABNORMAL HIGH (ref 0.0–1.2)

## 2024-05-12 LAB — I-STAT VENOUS BLOOD GAS, ED
Acid-Base Excess: 4 mmol/L — ABNORMAL HIGH (ref 0.0–2.0)
Bicarbonate: 28.9 mmol/L — ABNORMAL HIGH (ref 20.0–28.0)
Calcium, Ion: 1.08 mmol/L — ABNORMAL LOW (ref 1.15–1.40)
HCT: 50 % (ref 39.0–52.0)
Hemoglobin: 17 g/dL (ref 13.0–17.0)
O2 Saturation: 46 %
Potassium: 4.9 mmol/L (ref 3.5–5.1)
Sodium: 132 mmol/L — ABNORMAL LOW (ref 135–145)
TCO2: 30 mmol/L (ref 22–32)
pCO2, Ven: 43.1 mmHg — ABNORMAL LOW (ref 44–60)
pH, Ven: 7.434 — ABNORMAL HIGH (ref 7.25–7.43)
pO2, Ven: 24 mmHg — CL (ref 32–45)

## 2024-05-12 LAB — TROPONIN I (HIGH SENSITIVITY): Troponin I (High Sensitivity): 6 ng/L (ref <18)

## 2024-05-12 LAB — CBG MONITORING, ED
Glucose-Capillary: 327 mg/dL — ABNORMAL HIGH (ref 70–99)
Glucose-Capillary: 350 mg/dL — ABNORMAL HIGH (ref 70–99)

## 2024-05-12 LAB — I-STAT CHEM 8, ED
BUN: 16 mg/dL (ref 6–20)
Calcium, Ion: 0.94 mmol/L — ABNORMAL LOW (ref 1.15–1.40)
Chloride: 102 mmol/L (ref 98–111)
Creatinine, Ser: 0.8 mg/dL (ref 0.61–1.24)
Glucose, Bld: 347 mg/dL — ABNORMAL HIGH (ref 70–99)
HCT: 46 % (ref 39.0–52.0)
Hemoglobin: 15.6 g/dL (ref 13.0–17.0)
Potassium: 4 mmol/L (ref 3.5–5.1)
Sodium: 133 mmol/L — ABNORMAL LOW (ref 135–145)
TCO2: 22 mmol/L (ref 22–32)

## 2024-05-12 LAB — LIPASE, BLOOD: Lipase: 47 U/L (ref 11–51)

## 2024-05-12 LAB — OSMOLALITY: Osmolality: 319 mosm/kg — ABNORMAL HIGH (ref 275–295)

## 2024-05-12 LAB — BETA-HYDROXYBUTYRIC ACID: Beta-Hydroxybutyric Acid: 3.44 mmol/L — ABNORMAL HIGH (ref 0.05–0.27)

## 2024-05-12 MED ORDER — SODIUM CHLORIDE 0.9 % IV BOLUS: 1000.0000 mL | Freq: Once | INTRAVENOUS | Status: DC

## 2024-05-12 MED ORDER — DEXTROSE 50 % IV SOLN: 0.0000 mL | INTRAVENOUS | Status: DC | PRN

## 2024-05-12 MED ORDER — SODIUM CHLORIDE 0.9 % IV BOLUS
1000.0000 mL | Freq: Once | INTRAVENOUS | Status: AC
  Administered 2024-05-12: 1000 mL via INTRAVENOUS

## 2024-05-12 MED ORDER — DEXTROSE IN LACTATED RINGERS 5 % IV SOLN: INTRAVENOUS | Status: DC

## 2024-05-12 MED ORDER — INSULIN REGULAR(HUMAN) IN NACL 100-0.9 UT/100ML-% IV SOLN
INTRAVENOUS | Status: DC
  Filled 2024-05-12: qty 100

## 2024-05-12 MED ORDER — INSULIN ASPART 100 UNIT/ML IJ SOLN
6.0000 [IU] | Freq: Once | INTRAMUSCULAR | Status: AC
  Administered 2024-05-12: 6 [IU] via SUBCUTANEOUS

## 2024-05-12 MED ORDER — INSULIN ASPART 100 UNIT/ML IJ SOLN: 6.0000 [IU] | Freq: Once | INTRAMUSCULAR | Status: DC

## 2024-05-12 MED ORDER — POTASSIUM CHLORIDE 10 MEQ/100ML IV SOLN
10.0000 meq | INTRAVENOUS | Status: DC
  Filled 2024-05-12: qty 100

## 2024-05-12 MED ORDER — METFORMIN HCL 500 MG PO TABS: 500.0000 mg | ORAL_TABLET | Freq: Two times a day (BID) | ORAL | 0 refills | Status: DC

## 2024-05-12 MED ORDER — LACTATED RINGERS IV BOLUS: 1000.0000 mL | INTRAVENOUS | Status: DC

## 2024-05-12 MED ORDER — METFORMIN HCL 500 MG PO TABS: 500.0000 mg | ORAL_TABLET | Freq: Once | ORAL | Status: DC

## 2024-05-12 MED ORDER — LACTATED RINGERS IV SOLN: INTRAVENOUS | Status: DC

## 2024-05-12 MED ORDER — ALUM & MAG HYDROXIDE-SIMETH 200-200-20 MG/5ML PO SUSP
30.0000 mL | Freq: Once | ORAL | Status: AC
  Administered 2024-05-12: 30 mL via ORAL
  Filled 2024-05-12: qty 30

## 2024-05-12 MED ORDER — INSULIN ASPART 100 UNIT/ML IJ SOLN: 5.0000 [IU] | Freq: Once | INTRAMUSCULAR | Status: DC

## 2024-05-12 MED ORDER — ONDANSETRON HCL 4 MG/2ML IJ SOLN
4.0000 mg | Freq: Once | INTRAMUSCULAR | Status: AC
  Administered 2024-05-12: 4 mg via INTRAVENOUS
  Filled 2024-05-12: qty 2

## 2024-05-12 MED ORDER — HYOSCYAMINE SULFATE 0.125 MG SL SUBL
0.2500 mg | SUBLINGUAL_TABLET | Freq: Once | SUBLINGUAL | Status: AC
Start: 2024-05-12 — End: 2024-05-12
  Administered 2024-05-12: 0.25 mg via SUBLINGUAL
  Filled 2024-05-12: qty 2

## 2024-05-12 NOTE — ED Provider Notes (Signed)
 [place] EMERGENCY DEPARTMENT AT [facility] Provider Note   CSN: [id number] Arrival date & time: 05/11/24  2118     Patient presents with: Chest Pain and Shortness of Breath   Christopher [name] is a 53 y.o. male.  Patient with past medical history of hypertension, hyperlipidemia, history of STEMI, EtOH abuse presents to emergency room with multiple complaints.  Patient reports that over the past month he has had increased thirst, frequent urination.  He has had a family members blood glucose monitor at home read high on several occasions.  He is not a known diabetic.  He notes that today he started experiencing epigastric pain after eating some Doritos and this also made him feel short of breath.  He has noted a weird smell recently.  He denies any fever or chill.  No cough.  No vomiting or diarrhea.    Chest Pain Associated symptoms: abdominal pain and shortness of breath   Shortness of Breath Associated symptoms: abdominal pain and chest pain        Prior to Admission medications   Medication Sig Start Date End Date Taking? Authorizing Provider  albuterol (PROVENTIL HFA;VENTOLIN HFA) 108 (90 BASE) MCG/ACT inhaler Inhale 2 puffs into the lungs every 6 (six) hours as needed. For shortness of breath    [provider]  aspirin  EC 81 MG tablet Take 1 tablet (81 mg total) by mouth daily. Swallow whole. 12/16/22   Hayes Beckey LITTIE, NP  atorvastatin  (LIPITOR ) 80 MG tablet Take 1 tablet (80 mg total) by mouth daily. 01/15/23   Lucien Orren SAILOR, PA-C  cetirizine (ZYRTEC) 10 MG tablet Take 10 mg by mouth daily as needed.     [provider]  clopidogrel  (PLAVIX ) 75 MG tablet Take 1 tablet (75 mg total) by mouth daily. 11/18/23   Wonda Sharper, MD  dapagliflozin  propanediol (FARXIGA ) 10 MG TABS tablet Take 1 tablet (10 mg total) by mouth daily. 01/15/23   Lucien Orren SAILOR, PA-C  divalproex  (DEPAKOTE ) 250 MG DR tablet Take 1,000 mg by mouth at bedtime. 11/18/22   [provider]  gabapentin (NEURONTIN) 100 MG capsule Take 100 mg by mouth as needed. 05/25/19   [provider]  losartan  (COZAAR ) 50 MG tablet Take 1 tablet (50 mg total) by mouth daily. 11/18/23   Wonda Sharper, MD  metoprolol  succinate (TOPROL -XL) 50 MG 24 hr tablet Take 1 tablet (50 mg total) by mouth daily. Take with or immediately following a meal. 11/18/23   Wonda Sharper, MD  Multiple Vitamins-Minerals (MULTIVITAMIN ADULT PO) Take by mouth.    [provider]  nitroGLYCERIN  (NITROSTAT ) 0.4 MG SL tablet DISSOLVE ONE TABLET UNDER THE TONGUE EVERY 5 MINUTES AS NEEDED FOR CHEST PAIN.  DO NOT EXCEED A TOTAL OF 3 DOSES IN 15 MINUTES 04/03/24   Wonda Sharper, MD  pantoprazole  (PROTONIX ) 40 MG tablet Take 1 tablet (40 mg total) by mouth daily. 11/18/23   Wonda Sharper, MD  spironolactone  (ALDACTONE ) 25 MG tablet Take 1 tablet (25 mg total) by mouth daily. 01/15/23   Lucien Orren SAILOR, PA-C  thiamine  (VITAMIN B1) 100 MG tablet Take 1 tablet (100 mg total) by mouth daily. 12/16/22   Hayes Beckey LITTIE, NP  traZODone  (DESYREL ) 50 MG tablet Take 50 mg by mouth as needed for sleep.    [provider]  venlafaxine  XR (EFFEXOR -XR) 75 MG 24 hr capsule Take 75 mg by mouth daily. 09/29/22   [provider]    Allergies: Patient  has no known allergies.    Review of Systems  Respiratory:  Positive for shortness of breath.   Cardiovascular:  Positive for chest pain.  Gastrointestinal:  Positive for abdominal pain.    Updated Vital Signs BP (!) 129/99   Pulse 90   Temp 98.4 F (36.9 C)   Resp 14   Wt 108.9 kg   SpO2 100%   BMI 34.44 kg/m   Physical Exam Vitals and nursing note reviewed.  Constitutional:      General: He is not in acute distress.    Appearance: He is not toxic-appearing.  HENT:     Head: Normocephalic and atraumatic.  Eyes:     General: No scleral icterus.    Conjunctiva/sclera: Conjunctivae normal.  Cardiovascular:     Rate and Rhythm: Normal  rate and regular rhythm.     Pulses: Normal pulses.     Heart sounds: Normal heart sounds.  Pulmonary:     Effort: Pulmonary effort is normal. No respiratory distress.     Breath sounds: Normal breath sounds.  Chest:     Chest wall: Tenderness present.  Abdominal:     General: Abdomen is flat. Bowel sounds are normal.     Palpations: Abdomen is soft.     Tenderness: There is abdominal tenderness.     Comments: Tenderness to palpation and epigastric area.  Musculoskeletal:     Right lower leg: No edema.     Left lower leg: No edema.  Skin:    General: Skin is warm and dry.     Findings: No lesion.  Neurological:     General: No focal deficit present.     Mental Status: He is alert and oriented to person, place, and time. Mental status is at baseline.     (all labs ordered are listed, but only abnormal results are displayed) Labs Reviewed  CBC - Abnormal; Notable for the following components:      Result Value   RBC 5.98 (*)    Hemoglobin 18.5 (*)    MCHC 37.1 (*)    All other components within normal limits  CBG MONITORING, ED - Abnormal; Notable for the following components:   Glucose-Capillary 427 (*)    All other components within normal limits  I-STAT VENOUS BLOOD GAS, ED - Abnormal; Notable for the following components:   pH, Ven 7.434 (*)    pCO2, Ven 43.1 (*)    pO2, Ven 24 (*)    Bicarbonate 28.9 (*)    Acid-Base Excess 4.0 (*)    Sodium 132 (*)    Calcium , Ion 1.08 (*)    All other components within normal limits  HEPATIC FUNCTION PANEL  LIPASE, BLOOD  URINALYSIS, ROUTINE W REFLEX MICROSCOPIC  BASIC METABOLIC PANEL WITH GFR  OSMOLALITY  BETA-HYDROXYBUTYRIC ACID  TROPONIN I (HIGH SENSITIVITY)  TROPONIN I (HIGH SENSITIVITY)    EKG: None  Radiology: DG Chest 2 View Result Date: 05/11/2024 CLINICAL DATA:  Chest pain EXAM: CHEST - 2 VIEW COMPARISON:  12/13/2022 FINDINGS: The heart size and mediastinal contours are within normal limits. Both lungs are  clear. The visualized skeletal structures are unremarkable. IMPRESSION: No active cardiopulmonary disease. Electronically Signed   By: Luke Bun M.D.   On: 05/11/2024 21:59     Procedures   Medications Ordered in the ED - No data to display  Clinical Course as of 05/12/24 0613  Thu May 11, 2024  2222 EKG sinus tach [JB]  Fri May 12, 2024  0518  CBG 350 on recheck, patient reports he ate graham crackers.  [JB]    Clinical Course User Index [JB] Shizuo Biskup, Warren SAILOR, PA-C                                 Medical Decision Making Amount and/or Complexity of Data Reviewed Labs: ordered. Radiology: ordered.  Risk OTC drugs. Prescription drug management.   This patient presents to the ED for concern of chest pain, this involves an extensive number of treatment options, and is a complaint that carries with it a high risk of complications and morbidity.  The differential diagnosis includes ACS, pneumonia, PE, pancreatitis   Co morbidities that complicate the patient evaluation  Alcohol abuse, hyperlipidemia, coronary artery disease   Lab Tests:  I personally interpreted labs.  The pertinent results include:   CBC without leukocytosis.  BMP without anion gap.  Glucose is elevated at 337.  Sodium is 131 likely pseudo hyponatremia secondary to hyperglycemia. Lipase negative Beta hydroxy is elevated at 3.4   Imaging Studies ordered:  I ordered imaging studies including chest x-ray I independently visualized and interpreted imaging which showed no acute findings I agree with the radiologist interpretation   Cardiac Monitoring: / EKG:  The patient was maintained on a cardiac monitor.  I personally viewed and interpreted the cardiac monitored which showed an underlying rhythm of: Sinus tachycardia   Problem List / ED Course / Critical interventions / Medication management  Patient presents to emergency room with complaint of chest pain which she locates to epigastric area.   He does have some mild tenderness to palpation.  This is associated with nausea.  Does not seem consistent with ACS and has had 2 flat troponins here.  EKG shows sinus tachycardia.  Symptoms do not seem consistent with PE or aortic dissection.  He has no sign of DVT on exam and no history of similar or recent travel.  He did have improvement in symptoms after Zofran  and GI cocktail.  His glucose was found to be elevated here with elevated beta hydroxybutyric acid and he does not have anion gap and he has normal VBG.  After rehydration and insulin  patient reports he is feeling much better.  He has been hemodynamically stable throughout stay.  He is tolerating oral intake and has good follow-up with primary care.  He reports that he has an appointment coming up on Monday.  If getting improvement of symptoms we will start on metformin  and have him follow-up with primary care.  He was given return precautions. I ordered medication including GI cocktail, NS Given first dose of metformin .  Reevaluation of the patient after these medicines showed that the patient improved I have reviewed the patients home medicines and have made adjustments as needed   Plan F/u w/ PCP in 2-3d to ensure resolution of sx.  Patient was given return precautions. Patient stable for discharge at this time.  Patient educated on sx/dx and verbalized understanding of plan. Return to ER w/ new or worsening sx.       Final diagnoses:  Hyperglycemia  Type 2 diabetes mellitus without complication, without long-term current use of insulin  [facility]    ED Discharge Orders          Ordered    metFORMIN  (GLUCOPHAGE ) 500 MG tablet  2 times daily with meals        05/12/24 0318  January Bergthold N, PA-C 05/12/24 [id number]    Darra Fonda MATSU, MD 05/13/24 [phone]

## 2024-05-12 NOTE — Discharge Instructions (Signed)
 Your labs are consistent with diabetes.  I have started you on metformin .  I would also recommend diet changes as discussed and exercise.  Make sure staying well-hydrated with primarily water.  Please follow-up with your primary care doctor early next week for recheck.  Return to ER with worsening symptoms

## 2024-05-17 DIAGNOSIS — Z955 Presence of coronary angioplasty implant and graft: Secondary | ICD-10-CM | POA: Diagnosis not present

## 2024-05-17 DIAGNOSIS — I10 Essential (primary) hypertension: Secondary | ICD-10-CM | POA: Diagnosis not present

## 2024-05-17 DIAGNOSIS — E1159 Type 2 diabetes mellitus with other circulatory complications: Secondary | ICD-10-CM | POA: Diagnosis not present

## 2024-06-01 ENCOUNTER — Other Ambulatory Visit: Payer: Self-pay | Admitting: Physician Assistant
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
## 2024-07-18 ENCOUNTER — Ambulatory Visit: Payer: MEDICAID | Attending: Physician Assistant | Admitting: Physician Assistant

## 2024-07-18 VITALS — BP 132/86 | HR 80 | Ht 70.0 in | Wt 224.6 lb

## 2024-07-18 DIAGNOSIS — I2510 Atherosclerotic heart disease of native coronary artery without angina pectoris: Secondary | ICD-10-CM

## 2024-07-18 DIAGNOSIS — G4719 Other hypersomnia: Secondary | ICD-10-CM

## 2024-07-18 DIAGNOSIS — I10 Essential (primary) hypertension: Secondary | ICD-10-CM

## 2024-07-18 DIAGNOSIS — E782 Mixed hyperlipidemia: Secondary | ICD-10-CM

## 2024-07-18 LAB — HEMOGLOBIN A1C
Est. average glucose Bld gHb Est-mCnc: 243 mg/dL
Hgb A1c MFr Bld: 10.1 % — ABNORMAL HIGH (ref 4.8–5.6)

## 2024-07-18 MED ORDER — EZETIMIBE 10 MG PO TABS: 10.0000 mg | ORAL_TABLET | Freq: Every day | ORAL | 3 refills | Status: AC

## 2024-07-18 MED ORDER — METFORMIN HCL 500 MG PO TABS: 500.0000 mg | ORAL_TABLET | Freq: Two times a day (BID) | ORAL | 3 refills | Status: AC

## 2024-07-18 NOTE — Patient Instructions (Addendum)
 Medication Instructions:   START TAKING : ZETIA  10 MG ONCE A DAY   STOP TAKING :  ASPIRIN    *If you need a refill on your cardiac medications before your next appointment, please call your pharmacy*  Lab Work:  PLEASE GO DOWN STAIRS  [facility]  FIRST FLOOR   ( GET OFF ELEVATORS WALK TOWARDS WAITING AREA LAB LOCATED BY PHARMACY): A1C TODAY   RETURN FASTING IN 3 MONTHS FOR LIPID    If you have labs (blood work) drawn today and your tests are completely normal, you will receive your results only by: MyChart Message (if you have MyChart) OR A paper copy in the mail If you have any lab test that is abnormal or we need to change your treatment, we will call you to review the results.  Testing/Procedures: NONE ORDERED  TODAY     Follow-Up: At [facility], you and your health needs are our priority.  As part of our continuing mission to provide you with exceptional heart care, our providers are all part of one team.  This team includes your primary Cardiologist (physician) and Advanced Practice Providers or APPs (Physician Assistants and Nurse Practitioners) who all work together to provide you with the care you need, when you need it.  Your next appointment:   6 month(s)   Provider:  Orren Fabry, PA-C       We recommend signing up for the patient portal called MyChart.  Sign up information is provided on this After Visit Summary.  MyChart is used to connect with patients for Virtual Visits (Telemedicine).  Patients are able to view lab/test results, encounter notes, upcoming appointments, etc.  Non-urgent messages can be sent to your provider as well.   To learn more about what you can do with MyChart, go to [email or web address].   Other Instructions   Low-Sodium Eating Plan Salt (sodium) helps you keep a healthy balance of fluids in your body. Too much sodium can raise your blood pressure. It can also cause fluid and waste to be held in your body. Your health care  provider or dietitian may recommend a low-sodium eating plan if you have high blood pressure (hypertension), kidney disease, liver disease, or heart failure. Eating less sodium can help lower your blood pressure and reduce swelling. It can also protect your heart, liver, and kidneys. What are tips for following this plan? Reading food labels  Check food labels for the amount of sodium per serving. If you eat more than one serving, you must multiply the listed amount by the number of servings. Choose foods with less than 140 milligrams (mg) of sodium per serving. Avoid foods with 300 mg of sodium or more per serving. Always check how much sodium is in a product, even if the label says unsalted or no salt added. Shopping  Buy products labeled as low-sodium or no salt added. Buy fresh foods. Avoid canned foods and pre-made or frozen meals. Avoid canned, cured, or processed meats. Buy breads that have less than 80 mg of sodium per slice. Cooking  Eat more home-cooked food. Try to eat less restaurant, buffet, and fast food. Try not to add salt when you cook. Use salt-free seasonings or herbs instead of table salt or sea salt. Check with your provider or pharmacist before using salt substitutes. Cook with plant-based oils, such as canola, sunflower, or olive oil. Meal planning When eating at a restaurant, ask if your food can be made with less salt or no salt.  Avoid dishes labeled as brined, pickled, cured, or smoked. Avoid dishes made with soy sauce, miso, or teriyaki sauce. Avoid foods that have monosodium glutamate (MSG) in them. MSG may be added to some restaurant food, sauces, soups, bouillon, and canned foods. Make meals that can be grilled, baked, poached, roasted, or steamed. These are often made with less sodium. General information Try to limit your sodium intake to 1,500-2,300 mg each day, or the amount told by your provider. What foods should I eat? Fruits Fresh, frozen, or  canned fruit. Fruit juice. Vegetables Fresh or frozen vegetables. No salt added canned vegetables. No salt added tomato sauce and paste. Low-sodium or reduced-sodium tomato and vegetable juice. Grains Low-sodium cereals, such as oats, puffed wheat and rice, and shredded wheat. Low-sodium crackers. Unsalted rice. Unsalted pasta. Low-sodium bread. Whole grain breads and whole grain pasta. Meats and other proteins Fresh or frozen meat, poultry, seafood, and fish. These should have no added salt. Low-sodium canned tuna and salmon. Unsalted nuts. Dried peas, beans, and lentils without added salt. Unsalted canned beans. Eggs. Unsalted nut butters. Dairy Milk. Soy milk. Cheese that is naturally low in sodium, such as ricotta cheese, fresh mozzarella, or Swiss cheese. Low-sodium or reduced-sodium cheese. Cream cheese. Yogurt. Seasonings and condiments Fresh and dried herbs and spices. Salt-free seasonings. Low-sodium mustard and ketchup. Sodium-free salad dressing. Sodium-free light mayonnaise. Fresh or refrigerated horseradish. Lemon juice. Vinegar. Other foods Homemade, reduced-sodium, or low-sodium soups. Unsalted popcorn and pretzels. Low-salt or salt-free chips. The items listed above may not be all the foods and drinks you can have. Talk to a dietitian to learn more. What foods should I avoid? Vegetables Sauerkraut, pickled vegetables, and relishes. Olives. [place] fries. Onion rings. Regular canned vegetables, except low-sodium or reduced-sodium items. Regular canned tomato sauce and paste. Regular tomato and vegetable juice. Frozen vegetables in sauces. Grains Instant hot cereals. Bread stuffing, pancake, and biscuit mixes. Croutons. Seasoned rice or pasta mixes. Noodle soup cups. Boxed or frozen macaroni and cheese. Regular salted crackers. Self-rising flour. Meats and other proteins Meat or fish that is salted, canned, smoked, spiced, or pickled. Precooked or cured meat, such as sausages or  meat loaves. [place]. Ham. Pepperoni. Hot dogs. Corned beef. Chipped beef. Salt pork. Jerky. Pickled herring, anchovies, and sardines. Regular canned tuna. Salted nuts. Dairy Processed cheese and cheese spreads. Hard cheeses. Cheese curds. Blue cheese. Feta cheese. String cheese. Regular cottage cheese. Buttermilk. Canned milk. Fats and oils Salted butter. Regular margarine. Ghee. Bacon fat. Seasonings and condiments Onion salt, garlic salt, seasoned salt, table salt, and sea salt. Canned and packaged gravies. Worcestershire sauce. Tartar sauce. Barbecue sauce. Teriyaki sauce. Soy sauce, including reduced-sodium soy sauce. Steak sauce. Fish sauce. Oyster sauce. Cocktail sauce. Horseradish that you find on the shelf. Regular ketchup and mustard. Meat flavorings and tenderizers. Bouillon cubes. Hot sauce. Pre-made or packaged marinades. Pre-made or packaged taco seasonings. Relishes. Regular salad dressings. Salsa. Other foods Salted popcorn and pretzels. Corn chips and puffs. Potato and tortilla chips. Canned or dried soups. Pizza. Frozen entrees and pot pies. The items listed above may not be all the foods and drinks you should avoid. Talk to a dietitian to learn more. This information is not intended to replace advice given to you by your health care provider. Make sure you discuss any questions you have with your health care provider. Document Revised: 11/05/2022 Document Reviewed: 11/05/2022 Elsevier Patient Education  2024 Else[employer] Inc[employer]      Heart-Healthy Eating Plan Eating a healthy diet  is important for the health of your heart. A heart-healthy eating plan includes: Eating less unhealthy fats. Eating more healthy fats. Eating less salt in your food. Salt is also called sodium. Making other changes in your diet. Talk with your doctor or a diet specialist (dietitian) to create an eating plan that is right for you. What is my plan? Your doctor may recommend an eating plan that  includes: Total fat: ______% or less of total calories a day. Saturated fat: ______% or less of total calories a day. Cholesterol: less than _________mg a day. Sodium: less than _________mg a day. What are tips for following this plan? Cooking Avoid frying your food. Try to bake, boil, grill, or broil it instead. You can also reduce fat by: Removing the skin from poultry. Removing all visible fats from meats. Steaming vegetables in water or broth. Meal planning  At meals, divide your plate into four equal parts: Fill one-half of your plate with vegetables and green salads. Fill one-fourth of your plate with whole grains. Fill one-fourth of your plate with lean protein foods. Eat 2-4 cups of vegetables per day. One cup of vegetables is: 1 cup (91 g) broccoli or cauliflower florets. 2 medium carrots. 1 large bell pepper. 1 large sweet potato. 1 large tomato. 1 medium white potato. 2 cups (150 g) raw leafy greens. Eat 1-2 cups of fruit per day. One cup of fruit is: 1 small apple 1 large banana 1 cup (237 g) mixed fruit, 1 large orange,  cup (82 g) dried fruit, 1 cup (240 mL) 100% fruit juice. Eat more foods that have soluble fiber. These are apples, broccoli, carrots, beans, peas, and barley. Try to get 20-30 g of fiber per day. Eat 4-5 servings of nuts, legumes, and seeds per week: 1 serving of dried beans or legumes equals  cup (90 g) cooked. 1 serving of nuts is  oz (12 almonds, 24 pistachios, or 7 walnut halves). 1 serving of seeds equals  oz (8 g). General information Eat more home-cooked food. Eat less restaurant, buffet, and fast food. Limit or avoid alcohol. Limit foods that are high in starch and sugar. Avoid fried foods. Lose weight if you are overweight. Keep track of how much salt (sodium) you eat. This is important if you have high blood pressure. Ask your doctor to tell you more about this. Try to add vegetarian meals each week. Fats Choose healthy  fats. These include olive oil and canola oil, flaxseeds, walnuts, almonds, and seeds. Eat more omega-3 fats. These include salmon, mackerel, sardines, tuna, flaxseed oil, and ground flaxseeds. Try to eat fish at least 2 times each week. Check food labels. Avoid foods with trans fats or high amounts of saturated fat. Limit saturated fats. These are often found in animal products, such as meats, butter, and cream. These are also found in plant foods, such as palm oil, palm kernel oil, and coconut oil. Avoid foods with partially hydrogenated oils in them. These have trans fats. Examples are stick margarine, some tub margarines, cookies, crackers, and other baked goods. What foods should I eat? Fruits All fresh, canned (in natural juice), or frozen fruits. Vegetables Fresh or frozen vegetables (raw, steamed, roasted, or grilled). Green salads. Grains Most grains. Choose whole wheat and whole grains most of the time. Rice and pasta, including brown rice and pastas made with whole wheat. Meats and other proteins Lean, well-trimmed beef, veal, pork, and lamb. Chicken and [place] without skin. All fish and shellfish. Wild duck,  rabbit, pheasant, and venison. Egg whites or low-cholesterol egg substitutes. Dried beans, peas, lentils, and tofu. Seeds and most nuts. Dairy Low-fat or nonfat cheeses, including ricotta and mozzarella. Skim or 1% milk that is liquid, powdered, or evaporated. Buttermilk that is made with low-fat milk. Nonfat or low-fat yogurt. Fats and oils Non-hydrogenated (trans-free) margarines. Vegetable oils, including soybean, sesame, sunflower, olive, peanut, safflower, corn, canola, and cottonseed. Salad dressings or mayonnaise made with a vegetable oil. Beverages Mineral water. Coffee and tea. Diet carbonated beverages. Sweets and desserts Sherbet, gelatin, and fruit ice. Small amounts of dark chocolate. Limit all sweets and desserts. Seasonings and condiments All seasonings and  condiments. The items listed above may not be a complete list of foods and drinks you can eat. Contact a dietitian for more options. What foods should I avoid? Fruits Canned fruit in heavy syrup. Fruit in cream or butter sauce. Fried fruit. Limit coconut. Vegetables Vegetables cooked in cheese, cream, or butter sauce. Fried vegetables. Grains Breads that are made with saturated or trans fats, oils, or whole milk. Croissants. Sweet rolls. Donuts. High-fat crackers, such as cheese crackers. Meats and other proteins Fatty meats, such as hot dogs, ribs, sausage, bacon, rib-eye roast or steak. High-fat deli meats, such as salami and bologna. Caviar. Domestic duck and goose. Organ meats, such as liver. Dairy Cream, sour cream, cream cheese, and creamed cottage cheese. Whole-milk cheeses. Whole or 2% milk that is liquid, evaporated, or condensed. Whole buttermilk. Cream sauce or high-fat cheese sauce. Yogurt that is made from whole milk. Fats and oils Meat fat, or shortening. Cocoa butter, hydrogenated oils, palm oil, coconut oil, palm kernel oil. Solid fats and shortenings, including bacon fat, salt pork, lard, and butter. Nondairy cream substitutes. Salad dressings with cheese or sour cream. Beverages Regular sodas and juice drinks with added sugar. Sweets and desserts Frosting. Pudding. Cookies. Cakes. Pies. Milk chocolate or white chocolate. Buttered syrups. Full-fat ice cream or ice cream drinks. The items listed above may not be a complete list of foods and drinks to avoid. Contact a dietitian for more information. Summary Heart-healthy meal planning includes eating less unhealthy fats, eating more healthy fats, and making other changes in your diet. Eat a balanced diet. This includes fruits and vegetables, low-fat or nonfat dairy, lean protein, nuts and legumes, whole grains, and heart-healthy oils and fats. This information is not intended to replace advice given to you by your health care  provider. Make sure you discuss any questions you have with your health care provider. Document Revised: 11/24/2021 Document Reviewed: 11/24/2021 Elsevier Patient Education  2024 [employer].

## 2024-07-19 ENCOUNTER — Telehealth: Payer: Self-pay | Admitting: Licensed Clinical Social Worker

## 2024-07-19 NOTE — Telephone Encounter (Signed)
 H&V Care Navigation CSW Progress Note  Clinical Social Worker contacted patient by phone to f/u on self pay status, looks like pt has had Medicaid application started, missing some income documents and financial counselor has a had a hard time getting in touch with pt. Attempted pt at [phone], voicemail full so could not leave message, will re-attempt again as able.  Patient is participating in a Managed Medicaid Plan:  No, self pay only  SDOH Screenings   Food Insecurity: No Food Insecurity (12/13/2022)  Housing: Low Risk  (12/13/2022)  Transportation Needs: No Transportation Needs (12/13/2022)  Utilities: Not At Risk (12/13/2022)  Financial Resource Strain: Low Risk  (10/31/2021)   Received from [facility]  Social Connections: Unknown (03/16/2022)   Received from [facility]  Stress: No Stress Concern Present (10/31/2021)   Received from [facility]  Tobacco Use: Medium Risk (05/11/2024)    Marit Lark, MSW, LCSW Clinical Social Worker II [facility] Heart/Vascular Care Navigation  [phone]- work cell phone (preferred)

## 2024-07-20 ENCOUNTER — Ambulatory Visit: Payer: Self-pay | Admitting: Physician Assistant

## 2024-07-21 ENCOUNTER — Telehealth: Payer: Self-pay | Admitting: Licensed Clinical Social Worker

## 2024-07-21 NOTE — Telephone Encounter (Signed)
 H&V Care Navigation CSW Progress Note  Clinical Social Worker contacted patient by phone to f/u on self pay status again today, looks like pt has had Medicaid application started, missing some income documents and financial counselor has a had a hard time getting in touch with pt. Attempted pt at [phone], voicemail full again so could not leave message, will re-attempt again as able.    Patient is participating in a Managed Medicaid Plan:  No, self pay only  SDOH Screenings   Food Insecurity: No Food Insecurity (12/13/2022)  Housing: Low Risk  (12/13/2022)  Transportation Needs: No Transportation Needs (12/13/2022)  Utilities: Not At Risk (12/13/2022)  Financial Resource Strain: Low Risk  (10/31/2021)   Received from [facility]  Social Connections: Unknown (03/16/2022)   Received from [facility]  Stress: No Stress Concern Present (10/31/2021)   Received from [facility]  Tobacco Use: Medium Risk (05/11/2024)    Marit Lark, MSW, LCSW Clinical Social Worker II [facility] Heart/Vascular Care Navigation  [phone]- work cell phone (preferred)

## 2024-07-25 ENCOUNTER — Telehealth: Payer: Self-pay | Admitting: Licensed Clinical Social Worker

## 2024-07-25 NOTE — Telephone Encounter (Signed)
 H&V Care Navigation CSW Progress Note  Clinical Social Worker contacted patient by phone to f/u on self pay status again today, pt had Medicaid application started,denied due to not providing needed information within requested timeline. Attempted pt at [phone], voicemail full again so could not leave message, will send myChart as I remain unable to reach pt.    Patient is participating in a Managed Medicaid Plan:  No, self pay only  SDOH Screenings   Food Insecurity: No Food Insecurity (12/13/2022)  Housing: Low Risk  (12/13/2022)  Transportation Needs: No Transportation Needs (12/13/2022)  Utilities: Not At Risk (12/13/2022)  Financial Resource Strain: Low Risk  (10/31/2021)   Received from [facility]  Social Connections: Unknown (03/16/2022)   Received from [facility]  Stress: No Stress Concern Present (10/31/2021)   Received from [facility]  Tobacco Use: Medium Risk (05/11/2024)    Marit Lark, MSW, LCSW Clinical Social Worker II [facility] Heart/Vascular Care Navigation  [phone]- work cell phone (preferred)
